# Patient Record
Sex: Male | Born: 1960 | Race: White | Hispanic: No | Marital: Married | Smoking: Former smoker
Health system: Southern US, Community
[De-identification: ages and names within clinical notes are randomized; demographics above are authoritative.]

## PROBLEM LIST (undated history)

## (undated) DIAGNOSIS — IMO0001 Reserved for inherently not codable concepts without codable children: Secondary | ICD-10-CM

## (undated) DIAGNOSIS — IMO0002 Reserved for concepts with insufficient information to code with codable children: Secondary | ICD-10-CM

## (undated) DIAGNOSIS — F419 Anxiety disorder, unspecified: Secondary | ICD-10-CM

## (undated) DIAGNOSIS — N400 Enlarged prostate without lower urinary tract symptoms: Secondary | ICD-10-CM

## (undated) DIAGNOSIS — E785 Hyperlipidemia, unspecified: Secondary | ICD-10-CM

## (undated) DIAGNOSIS — F319 Bipolar disorder, unspecified: Secondary | ICD-10-CM

## (undated) DIAGNOSIS — T7840XA Allergy, unspecified, initial encounter: Secondary | ICD-10-CM

## (undated) DIAGNOSIS — G709 Myoneural disorder, unspecified: Secondary | ICD-10-CM

## (undated) DIAGNOSIS — M1990 Unspecified osteoarthritis, unspecified site: Secondary | ICD-10-CM

## (undated) DIAGNOSIS — Z5189 Encounter for other specified aftercare: Secondary | ICD-10-CM

## (undated) HISTORY — DX: Hyperlipidemia, unspecified: E78.5

## (undated) HISTORY — DX: Encounter for other specified aftercare: Z51.89

## (undated) HISTORY — DX: Myoneural disorder, unspecified: G70.9

## (undated) HISTORY — PX: TONSILLECTOMY: SUR1361

## (undated) HISTORY — DX: Reserved for inherently not codable concepts without codable children: IMO0001

## (undated) HISTORY — PX: ROTATOR CUFF REPAIR: SHX139

## (undated) HISTORY — DX: Allergy, unspecified, initial encounter: T78.40XA

## (undated) HISTORY — DX: Unspecified osteoarthritis, unspecified site: M19.90

## (undated) HISTORY — DX: Reserved for concepts with insufficient information to code with codable children: IMO0002

## (undated) HISTORY — DX: Anxiety disorder, unspecified: F41.9

## (undated) HISTORY — PX: NASAL SINUS SURGERY: SHX719

## (undated) HISTORY — DX: Benign prostatic hyperplasia without lower urinary tract symptoms: N40.0

## (undated) HISTORY — DX: Bipolar disorder, unspecified: F31.9

---
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
## 2000-05-23 ENCOUNTER — Emergency Department (HOSPITAL_COMMUNITY): Admission: EM | Admit: 2000-05-23 | Discharge: 2000-05-23 | Payer: Self-pay

## 2001-06-03 ENCOUNTER — Ambulatory Visit (HOSPITAL_COMMUNITY): Admission: RE | Admit: 2001-06-03 | Discharge: 2001-06-03 | Payer: Self-pay | Admitting: Family Medicine

## 2001-06-03 ENCOUNTER — Encounter: Payer: Self-pay | Admitting: Family Medicine

## 2001-06-17 ENCOUNTER — Ambulatory Visit (HOSPITAL_COMMUNITY): Admission: RE | Admit: 2001-06-17 | Discharge: 2001-06-17 | Payer: Self-pay | Admitting: *Deleted

## 2001-06-17 ENCOUNTER — Encounter: Payer: Self-pay | Admitting: *Deleted

## 2001-11-28 ENCOUNTER — Encounter: Payer: Self-pay | Admitting: *Deleted

## 2001-11-28 ENCOUNTER — Encounter: Admission: RE | Admit: 2001-11-28 | Discharge: 2001-11-28 | Payer: Self-pay | Admitting: *Deleted

## 2001-12-12 ENCOUNTER — Encounter: Payer: Self-pay | Admitting: Family Medicine

## 2001-12-12 ENCOUNTER — Encounter: Admission: RE | Admit: 2001-12-12 | Discharge: 2001-12-12 | Payer: Self-pay | Admitting: Family Medicine

## 2003-01-06 ENCOUNTER — Inpatient Hospital Stay (HOSPITAL_COMMUNITY): Admission: EM | Admit: 2003-01-06 | Discharge: 2003-01-07 | Payer: Self-pay | Admitting: Emergency Medicine

## 2003-01-06 ENCOUNTER — Encounter: Payer: Self-pay | Admitting: Emergency Medicine

## 2006-06-24 ENCOUNTER — Encounter: Admission: RE | Admit: 2006-06-24 | Discharge: 2006-06-24 | Payer: Self-pay | Admitting: Orthopedic Surgery

## 2006-06-27 ENCOUNTER — Encounter: Admission: RE | Admit: 2006-06-27 | Discharge: 2006-06-27 | Payer: Self-pay | Admitting: Orthopedic Surgery

## 2007-12-22 ENCOUNTER — Ambulatory Visit: Payer: Self-pay | Admitting: Internal Medicine

## 2007-12-22 LAB — CONVERTED CEMR LAB
ALT: 39 units/L (ref 0–53)
AST: 25 units/L (ref 0–37)
Alkaline Phosphatase: 19 units/L — ABNORMAL LOW (ref 39–117)
Basophils Absolute: 0.1 10*3/uL (ref 0.0–0.1)
CO2: 30 meq/L (ref 19–32)
Chloride: 110 meq/L (ref 96–112)
Eosinophils Absolute: 0.3 10*3/uL (ref 0.0–0.7)
Eosinophils Relative: 4 % (ref 0.0–5.0)
GFR calc non Af Amer: 85 mL/min
HDL: 33 mg/dL — ABNORMAL LOW (ref >39.0)
Hemoglobin, Urine: NEGATIVE
Leukocytes, UA: NEGATIVE
Lymphocytes Relative: 31.1 % (ref 12.0–46.0)
MCHC: 34.8 g/dL (ref 30.0–36.0)
MCV: 91.9 fL (ref 78.0–100.0)
Neutrophils Relative %: 56.7 % (ref 43.0–77.0)
Platelets: 234 10*3/uL (ref 150–400)
Potassium: 4.6 meq/L (ref 3.5–5.1)
RBC: 4.88 M/uL (ref 4.22–5.81)
Specific Gravity, Urine: 1.015 (ref 1.000–1.03)
Total Bilirubin: 0.6 mg/dL (ref 0.3–1.2)
Total CHOL/HDL Ratio: 6.7
Urine Glucose: NEGATIVE mg/dL
Urobilinogen, UA: 0.2 (ref 0.0–1.0)
VLDL: 32 mg/dL (ref 0–40)
WBC: 8.1 10*3/uL (ref 4.5–10.5)

## 2007-12-24 ENCOUNTER — Ambulatory Visit: Payer: Self-pay | Admitting: Internal Medicine

## 2007-12-24 DIAGNOSIS — N401 Enlarged prostate with lower urinary tract symptoms: Secondary | ICD-10-CM | POA: Insufficient documentation

## 2007-12-24 DIAGNOSIS — F3176 Bipolar disorder, in full remission, most recent episode depressed: Secondary | ICD-10-CM | POA: Insufficient documentation

## 2007-12-24 DIAGNOSIS — E785 Hyperlipidemia, unspecified: Secondary | ICD-10-CM | POA: Insufficient documentation

## 2007-12-24 DIAGNOSIS — M5137 Other intervertebral disc degeneration, lumbosacral region: Secondary | ICD-10-CM | POA: Insufficient documentation

## 2008-06-28 ENCOUNTER — Ambulatory Visit: Payer: Self-pay | Admitting: Internal Medicine

## 2008-06-28 DIAGNOSIS — R6882 Decreased libido: Secondary | ICD-10-CM | POA: Insufficient documentation

## 2008-06-28 DIAGNOSIS — N400 Enlarged prostate without lower urinary tract symptoms: Secondary | ICD-10-CM | POA: Insufficient documentation

## 2008-06-30 ENCOUNTER — Encounter: Payer: Self-pay | Admitting: Internal Medicine

## 2008-11-08 ENCOUNTER — Ambulatory Visit: Payer: Self-pay | Admitting: Internal Medicine

## 2008-11-08 LAB — CONVERTED CEMR LAB
Albumin: 4 g/dL (ref 3.5–5.2)
Alkaline Phosphatase: 19 units/L — ABNORMAL LOW (ref 39–117)
Cholesterol: 206 mg/dL — ABNORMAL HIGH (ref 0–200)
Direct LDL: 153.2 mg/dL
HDL: 40.3 mg/dL (ref >39.00)
Total Protein: 7.3 g/dL (ref 6.0–8.3)
Triglycerides: 90 mg/dL (ref 0.0–149.0)
VLDL: 18 mg/dL (ref 0.0–40.0)

## 2010-02-08 ENCOUNTER — Observation Stay (HOSPITAL_COMMUNITY)
Admission: EM | Admit: 2010-02-08 | Discharge: 2010-02-09 | Payer: Self-pay | Source: Home / Self Care | Admitting: Emergency Medicine

## 2010-02-08 ENCOUNTER — Ambulatory Visit: Payer: Self-pay | Admitting: Internal Medicine

## 2010-02-09 ENCOUNTER — Encounter (INDEPENDENT_AMBULATORY_CARE_PROVIDER_SITE_OTHER): Payer: Self-pay | Admitting: Emergency Medicine

## 2010-02-10 ENCOUNTER — Ambulatory Visit: Payer: Self-pay | Admitting: Internal Medicine

## 2010-02-10 DIAGNOSIS — K219 Gastro-esophageal reflux disease without esophagitis: Secondary | ICD-10-CM | POA: Insufficient documentation

## 2010-03-10 ENCOUNTER — Ambulatory Visit: Payer: Self-pay | Admitting: Internal Medicine

## 2010-03-10 DIAGNOSIS — M545 Low back pain, unspecified: Secondary | ICD-10-CM | POA: Insufficient documentation

## 2010-04-03 ENCOUNTER — Telehealth: Payer: Self-pay | Admitting: Internal Medicine

## 2010-04-04 ENCOUNTER — Encounter: Payer: Self-pay | Admitting: Internal Medicine

## 2010-04-23 LAB — CONVERTED CEMR LAB
Cholesterol, target level: 200 mg/dL
Cholesterol: 229 mg/dL — ABNORMAL HIGH (ref 0–200)
Direct LDL: 165 mg/dL
HDL goal, serum: 40 mg/dL
LDL Goal: 130 mg/dL
TSH: 1.05 microintl units/mL (ref 0.35–5.50)
Triglycerides: 126 mg/dL (ref 0.0–149.0)

## 2010-04-25 NOTE — Letter (Signed)
Summary: Out of Work  [facility]  [address] [place], [place] [address]   Phone: [phone]  Fax: [phone]    February 10, 2010   Employee:  PIERO MUSTARD    To Whom It May Concern:   For Medical reasons, please excuse the above named employee from work. He may return to work on 02/20/2010.     If you need additional information, please feel free to contact our office.         Sincerely,    Alvy Beal A CMA for Dr. Sanda Linger  Appended Document: Out of Work return to work on 02/13/10/la

## 2010-04-25 NOTE — Assessment & Plan Note (Signed)
Summary: POST HOSP FU--DUE TO CHEST PAIN  STC   Vital Signs:  Patient profile:   50 year old male Height:      68 inches Weight:      150 pounds BMI:     22.89 O2 Sat:      99 % on Room air Temp:     97.9 degrees F oral Pulse rate:   76 / minute Pulse rhythm:   regular Resp:     16 per minute BP sitting:   114 / 80  (left arm) Cuff size:   large  Vitals Entered By: Rock Nephew CMA (February 10, 2010 2:05 PM)  O2 Flow:  Room air CC: Hospital follow up, Heartburn Is Patient Diabetic? No Pain Assessment Patient in pain? no        Primary Care Provider:  Etta Grandchild MD  CC:  Hospital follow up and Heartburn.  History of Present Illness:  Heartburn      This is a 50 year old man who presents with Heartburn.  The symptoms began 2 weeks ago.  The intensity is described as moderate.  The patient reports acid reflux, epigastric pain, and chest pain, but denies sour taste in mouth, trouble swallowing, weight loss, and weight gain.  The patient denies the following alarm features: melena, dysphagia, hematemesis, vomiting, involuntary weight loss >5%, and history of anemia.  Symptoms are worse with NSAIDs.  Treatment that was tried and either found to be ineffective or stopped due to problems include dietary changes, weight loss, and elevating the head of bed.  He had an overnight stay at the hospital 2 days ago and a cardiac cause for his chest pain was ruled out.  Dyspepsia History:      He has no alarm features of dyspepsia including no history of melena, hematochezia, dysphagia, persistent vomiting, or involuntary weight loss > 5%.  There is a prior history of GERD.  The patient does not have a prior history of documented ulcer disease.  The dominant symptom is heartburn or acid reflux.  An H-2 blocker medication is not currently being taken.     Preventive Screening-Counseling & Management  Alcohol-Tobacco     Alcohol drinks/day: 2     Alcohol type: beer     >5/day in  last 3 mos: no     Alcohol Counseling: not indicated; use of alcohol is not excessive or problematic     Feels need to cut down: no     Feels annoyed by complaints: no     Feels guilty re: drinking: no     Needs 'eye opener' in am: no     Smoking Status: quit > 6 months     Smoking Cessation Counseling: yes     Tobacco Counseling: to remain off tobacco products  Hep-HIV-STD-Contraception     Hepatitis Risk: no risk noted     HIV Risk: no risk noted     STD Risk: no risk noted     Sun Exposure-Excessive: yes     Sun Exposure Counseling: to decrease sun exposure      Sexual History:  currently monogamous.        Drug Use:  never.        Blood Transfusions:  no.    Medications Prior to Update: 1)  Seroquel 25 Mg Tabs (Quetiapine Fumarate) .... Take 1 Tablet By Mouth Once A Day 2)  Lamictal 100 Mg Tabs (Lamotrigine) 3)  Equetro 200 Mg Xr12h-Cap (Carbamazepine (Antipsychotic)) 4)  Diazepam 5 Mg Tabs (Diazepam) .... As Needed  Current Medications (verified): 1)  Seroquel 25 Mg Tabs (Quetiapine Fumarate) .... Take 1 Tablet By Mouth Once A Day 2)  Lamictal 100 Mg Tabs (Lamotrigine) 3)  Equetro 200 Mg Xr12h-Cap (Carbamazepine (Antipsychotic)) 4)  Diazepam 5 Mg Tabs (Diazepam) .... As Needed 5)  Prilosec Otc 20 Mg Tbec (Omeprazole Magnesium) .Marland Kitchen.. 1-2 By Mouth Once Daily For Heartburn  Allergies (verified): 1)  ! Penicillin  Past History:  Past Medical History: Last updated: 06/28/2008 Hyperlipidemia Benign prostatic hypertrophy Bipolar DDD  Past Surgical History: Last updated: 12/24/2007 Sinus surgery Tonsillectomy  Family History: Last updated: 12/24/2007 Family History of Arthritis Family History of Suicide attempt Ffather died from mesothelioma at 50 years of age  Social History: Last updated: 06/28/2008 Occupation:carpenter, student Married Alcohol use-yes Drug use-no Regular exercise-no  Risk Factors: Alcohol Use: 2 (02/10/2010) >5 drinks/d w/in last 3  months: no (02/10/2010) Exercise: no (12/24/2007)  Risk Factors: Smoking Status: quit > 6 months (02/10/2010)  Family History: Reviewed history from 12/24/2007 and no changes required. Family History of Arthritis Family History of Suicide attempt Ffather died from mesothelioma at 50 years of age  Social History: Reviewed history from 06/28/2008 and no changes required. Occupation:carpenter, student Married Alcohol use-yes Drug use-no Regular exercise-no  Review of Systems       The patient complains of chest pain and severe indigestion/heartburn.  The patient denies anorexia, fever, weight loss, weight gain, dyspnea on exertion, peripheral edema, prolonged cough, headaches, hemoptysis, abdominal pain, melena, hematochezia, hematuria, suspicious skin lesions, difficulty walking, depression, and angioedema.   Psych:  Complains of irritability; denies alternate hallucination ( auditory/visual), anxiety, depression, easily angered, easily tearful, mental problems, panic attacks, sense of great danger, suicidal thoughts/plans, thoughts of violence, unusual visions or sounds, and thoughts /plans of harming others.  Physical Exam  General:  alert, well-developed, well-nourished, well-hydrated, and appropriate dress.   Mouth:  Oral mucosa and oropharynx without lesions or exudates.  Teeth in good repair. Neck:  supple, full ROM, no masses, and no thyromegaly.   Lungs:  Normal respiratory effort, chest expands symmetrically. Lungs are clear to auscultation, no crackles or wheezes. Heart:  Normal rate and regular rhythm. S1 and S2 normal without gallop, murmur, click, rub or other extra sounds. Abdomen:  soft, non-tender, normal bowel sounds, no hepatomegaly, and no splenomegaly.   Rectal:  no external abnormalities, no hemorrhoids, normal sphincter tone, no masses, no tenderness, no fissures, no fistulae, and no perianal rash.   Genitalia:  circumcised, no hydrocele, no varicocele, no  scrotal masses, no testicular masses or atrophy, no cutaneous lesions, and no urethral discharge.   Prostate:  no nodules, no asymmetry, no induration, and 1+ enlarged.   Msk:  normal ROM, no joint tenderness, no joint swelling, no joint warmth, no redness over joints, no joint deformities, no joint instability, and no crepitation.   Skin:  turgor normal, color normal, no rashes, no suspicious lesions, no ecchymoses, no petechiae, no purpura, no ulcerations, and no edema.   Cervical Nodes:  no anterior cervical adenopathy and no posterior cervical adenopathy.   Inguinal Nodes:  no R inguinal adenopathy and no L inguinal adenopathy.   Psych:  Oriented X3, memory intact for recent and remote, normally interactive, good eye contact, not anxious appearing, not depressed appearing, not agitated, not suicidal, and not homicidal.     Impression & Recommendations:  Problem # 1:  GERD (ICD-530.81) Assessment New  His updated medication list for this problem  includes:    Prilosec Otc 20 Mg Tbec (Omeprazole magnesium) .Marland Kitchen... 1-2 by mouth once daily for heartburn  Labs Reviewed: Hgb: 15.6 (12/22/2007)   Hct: 44.8 (12/22/2007)  Orders: Hemoccult Guaiac-1 spec.(in office) (82270)  Complete Medication List: 1)  Seroquel 25 Mg Tabs (Quetiapine fumarate) .... Take 1 tablet by mouth once a day 2)  Lamictal 100 Mg Tabs (Lamotrigine) 3)  Equetro 200 Mg Xr12h-cap (Carbamazepine (antipsychotic)) 4)  Diazepam 5 Mg Tabs (Diazepam) .... As needed 5)  Prilosec Otc 20 Mg Tbec (Omeprazole magnesium) .Marland Kitchen.. 1-2 by mouth once daily for heartburn  Colorectal Screening:  Current Recommendations:    Hemoccult: NEG X 1 today  PSA Screening:    PSA: 1.15  (12/22/2007)  Patient Instructions: 1)  Please schedule a follow-up appointment in 1 month. 2)  Avoid foods high in acid (tomatoes, citrus juices, spicy foods). Avoid eating within two hours of lying down or before exercising. Do not over eat; try smaller more  frequent meals. Elevate head of bed twelve inches when sleeping. Prescriptions: PRILOSEC OTC 20 MG TBEC (OMEPRAZOLE MAGNESIUM) 1-2 by mouth once daily for heartburn  #60 x 0   Entered and Authorized by:   Etta Grandchild MD   Signed by:   Etta Grandchild MD on 02/10/2010   Method used:   Samples Given   RxID:   [id number]    Orders Added: 1)  Est. Patient Level IV [72536] 2)  Hemoccult Guaiac-1 spec.(in office) [82270]

## 2010-04-25 NOTE — Letter (Signed)
Summary: Out of Work  [facility]  [address] [place], [place] [address]   Phone: [phone]  Fax: [phone]    February 10, 2010   Employee:  RAINN BULLINGER    To Whom It May Concern:  For Medical reasons, please excuse the above named employee from work. He may return to work on 02/13/2010.  If you need additional information, please feel free to contact our office.         Sincerely,    Alvy Beal A CMA for Dr. Sanda Linger

## 2010-04-27 NOTE — Assessment & Plan Note (Signed)
Summary: 1 MOS F/U #/CD   Vital Signs:  Patient profile:   50 year old male Height:      68 inches Weight:      148.38 pounds BMI:     22.64 O2 Sat:      96 % on Room air Temp:     97.9 degrees F oral Pulse rate:   60 / minute Pulse rhythm:   regular Resp:     16 per minute BP sitting:   118 / 70  (left arm) Cuff size:   large  Vitals Entered By: Rock Nephew CMA (March 10, 2010 2:34 PM)  O2 Flow:  Room air  Primary Care Provider:  Etta Grandchild MD  CC:  Back pain.  History of Present Illness:  Back Pain      This is a 50 year old man who presents with Back pain.  The symptoms began 4-8 weeks ago.  The intensity is described as mild.  The patient denies fever, chills, weakness, loss of sensation, fecal incontinence, urinary incontinence, urinary retention, dysuria, rest pain, inability to work, and inability to care for self.  The pain is located in the mid low back.  The pain began at work and gradually.  The pain radiates to the left buttock.  The pain is made worse by flexion and extension.  The pain is made better by inactivity.    Preventive Screening-Counseling & Management  Alcohol-Tobacco     Alcohol drinks/day: 2     Alcohol type: beer     >5/day in last 3 mos: no     Alcohol Counseling: not indicated; use of alcohol is not excessive or problematic     Feels need to cut down: no     Feels annoyed by complaints: no     Feels guilty re: drinking: no     Needs 'eye opener' in am: no     Smoking Status: quit > 6 months     Smoking Cessation Counseling: yes     Tobacco Counseling: to remain off tobacco products  Hep-HIV-STD-Contraception     Hepatitis Risk: no risk noted     HIV Risk: no risk noted     STD Risk: no risk noted     Sun Exposure-Excessive: yes     Sun Exposure Counseling: to decrease sun exposure      Sexual History:  currently monogamous.        Drug Use:  never.        Blood Transfusions:  no.    Clinical Review  Panels:  Prevention   Last PSA:  1.15 (12/22/2007)  Lipid Management   Cholesterol:  206 (11/08/2008)   LDL (bad choesterol):  DEL (12/22/2007)   HDL (good cholesterol):  40.30 (11/08/2008)  Diabetes Management   HgBA1C:  5.5 (06/28/2008)   Creatinine:  1.0 (12/22/2007)  CBC   WBC:  8.1 (12/22/2007)   RBC:  4.88 (12/22/2007)   Hgb:  15.6 (12/22/2007)   Hct:  44.8 (12/22/2007)   Platelets:  234 (12/22/2007)   MCV  91.9 (12/22/2007)   MCHC  34.8 (12/22/2007)   RDW  12.9 (12/22/2007)   PMN:  56.7 (12/22/2007)   Lymphs:  31.1 (12/22/2007)   Monos:  7.4 (12/22/2007)   Eosinophils:  4.0 (12/22/2007)   Basophil:  0.8 (12/22/2007)  Complete Metabolic Panel   Glucose:  101 (12/22/2007)   Sodium:  143 (12/22/2007)   Potassium:  4.6 (12/22/2007)   Chloride:  110 (12/22/2007)  CO2:  30 (12/22/2007)   BUN:  9 (12/22/2007)   Creatinine:  1.0 (12/22/2007)   Albumin:  4.0 (11/08/2008)   Total Protein:  7.3 (11/08/2008)   Calcium:  9.2 (12/22/2007)   Total Bili:  0.7 (11/08/2008)   Alk Phos:  19 (11/08/2008)   SGPT (ALT):  42 (11/08/2008)   SGOT (AST):  32 (11/08/2008)   Medications Prior to Update: 1)  Seroquel 25 Mg Tabs (Quetiapine Fumarate) .... Take 1 Tablet By Mouth Once A Day 2)  Lamictal 100 Mg Tabs (Lamotrigine) 3)  Equetro 200 Mg Xr12h-Cap (Carbamazepine (Antipsychotic)) 4)  Diazepam 5 Mg Tabs (Diazepam) .... As Needed 5)  Prilosec Otc 20 Mg Tbec (Omeprazole Magnesium) .Marland Kitchen.. 1-2 By Mouth Once Daily For Heartburn  Current Medications (verified): 1)  Seroquel 25 Mg Tabs (Quetiapine Fumarate) .... Take 1 Tablet By Mouth Once A Day 2)  Lamictal 100 Mg Tabs (Lamotrigine) 3)  Equetro 200 Mg Xr12h-Cap (Carbamazepine (Antipsychotic)) 4)  Diazepam 5 Mg Tabs (Diazepam) .... As Needed 5)  Prilosec Otc 20 Mg Tbec (Omeprazole Magnesium) .Marland Kitchen.. 1-2 By Mouth Once Daily For Heartburn 6)  Tramadol Hcl 50 Mg Tabs (Tramadol Hcl) .Marland Kitchen.. 1-2 By Mouth Qid As Needed For Pain  Allergies  (verified): 1)  ! Penicillin  Past History:  Past Medical History: Last updated: 06/28/2008 Hyperlipidemia Benign prostatic hypertrophy Bipolar DDD  Past Surgical History: Last updated: 12/24/2007 Sinus surgery Tonsillectomy  Family History: Last updated: 12/24/2007 Family History of Arthritis Family History of Suicide attempt Ffather died from mesothelioma at 50 years of age  Social History: Last updated: 06/28/2008 Occupation:carpenter, student Married Alcohol use-yes Drug use-no Regular exercise-no  Risk Factors: Alcohol Use: 2 (03/10/2010) >5 drinks/d w/in last 3 months: no (03/10/2010) Exercise: no (12/24/2007)  Risk Factors: Smoking Status: quit > 6 months (03/10/2010)  Family History: Reviewed history from 12/24/2007 and no changes required. Family History of Arthritis Family History of Suicide attempt Ffather died from mesothelioma at 50 years of age  Social History: Reviewed history from 06/28/2008 and no changes required. Occupation:carpenter, student Married Alcohol use-yes Drug use-no Regular exercise-no  Review of Systems  The patient denies anorexia, fever, weight loss, weight gain, hoarseness, chest pain, syncope, dyspnea on exertion, peripheral edema, prolonged cough, headaches, hemoptysis, abdominal pain, melena, hematochezia, severe indigestion/heartburn, hematuria, suspicious skin lesions, enlarged lymph nodes, and angioedema.    Physical Exam  General:  alert, well-developed, well-nourished, well-hydrated, and appropriate dress.   Head:  normocephalic and atraumatic.   Mouth:  Oral mucosa and oropharynx without lesions or exudates.  Teeth in good repair. Neck:  supple, full ROM, no masses, and no thyromegaly.   Lungs:  Normal respiratory effort, chest expands symmetrically. Lungs are clear to auscultation, no crackles or wheezes. Heart:  Normal rate and regular rhythm. S1 and S2 normal without gallop, murmur, click, rub or other  extra sounds. Abdomen:  soft, non-tender, normal bowel sounds, no hepatomegaly, and no splenomegaly.   Msk:  normal ROM, no joint tenderness, no joint swelling, no joint warmth, no redness over joints, no joint deformities, no joint instability, and no crepitation.   Pulses:  R and L carotid,radial,femoral,dorsalis pedis and posterior tibial pulses are full and equal bilaterally Extremities:  No clubbing, cyanosis, edema, or deformity noted with normal full range of motion of all joints.   Neurologic:  No cranial nerve deficits noted. Station and gait are normal. Plantar reflexes are down-going bilaterally. DTRs are symmetrical throughout. Sensory, motor and coordinative functions appear intact. Skin:  Intact without  suspicious lesions or rashes Cervical Nodes:  no anterior cervical adenopathy and no posterior cervical adenopathy.   Axillary Nodes:  no R axillary adenopathy and no L axillary adenopathy.   Inguinal Nodes:  no R inguinal adenopathy and no L inguinal adenopathy.   Psych:  Cognition and judgment appear intact. Alert and cooperative with normal attention span and concentration. No apparent delusions, illusions, hallucinations   Detailed Back/Spine Exam  General:    Well-developed, well-nourished, in no acute distress; alert and oriented x 3.    Gait:    Normal heel-toe gait pattern bilaterally.    Lumbosacral Exam:  Inspection-deformity:    Normal Palpation-spinal tenderness:  Normal Range of Motion:    Forward Flexion:   60 degrees    Hyperextension:   25 degrees    Schober's:        >6 cm    Right Lateral Bend:   25 degrees    Left Lateral Bend:   25 degrees Squatting:  normal Lying Straight Leg Raise:    Right:  negative    Left:  negative Sitting Straight Leg Raise:    Right:  negative    Left:  negative Reverse Straight Leg Raise:    Right:  negative    Left:  negative Contralateral Straight Leg Raise:    Right:  negative    Left:  negative Sciatic  Notch:    There is no sciatic notch tenderness. Toe Walking:    Right:  normal    Left:  normal Heel Walking:    Right:  normal    Left:  normal   Impression & Recommendations:  Problem # 1:  LOW BACK PAIN, CHRONIC (ICD-724.2) Assessment New  His updated medication list for this problem includes:    Tramadol Hcl 50 Mg Tabs (Tramadol hcl) .Marland Kitchen... 1-2 by mouth qid as needed for pain  Orders: Orthopedic Referral (Ortho)  Problem # 2:  GERD (ICD-530.81) Assessment: Improved  His updated medication list for this problem includes:    Prilosec Otc 20 Mg Tbec (Omeprazole magnesium) .Marland Kitchen... 1-2 by mouth once daily for heartburn  Labs Reviewed: Hgb: 15.6 (12/22/2007)   Hct: 44.8 (12/22/2007)  Complete Medication List: 1)  Seroquel 25 Mg Tabs (Quetiapine fumarate) .... Take 1 tablet by mouth once a day 2)  Lamictal 100 Mg Tabs (Lamotrigine) 3)  Equetro 200 Mg Xr12h-cap (Carbamazepine (antipsychotic)) 4)  Diazepam 5 Mg Tabs (Diazepam) .... As needed 5)  Prilosec Otc 20 Mg Tbec (Omeprazole magnesium) .Marland Kitchen.. 1-2 by mouth once daily for heartburn 6)  Tramadol Hcl 50 Mg Tabs (Tramadol hcl) .Marland Kitchen.. 1-2 by mouth qid as needed for pain  Patient Instructions: 1)  Please schedule a follow-up appointment in 1 month. 2)  Avoid foods high in acid (tomatoes, citrus juices, spicy foods). Avoid eating within two hours of lying down or before exercising. Do not over eat; try smaller more frequent meals. Elevate head of bed twelve inches when sleeping. 3)  Take 650-1000mg  of Tylenol every 4-6 hours as needed for relief of pain or comfort of fever AVOID taking more than 4000mg   in a 24 hour period (can cause liver damage in higher doses). 4)  Take 400-600mg  of Ibuprofen (Advil, Motrin) with food every 4-6 hours as needed for relief of pain or comfort of fever. 5)  Most patients (90%) with low back pain will improve with time (2-6 weeks). Keep active but avoid activities that are painful. Apply moist heat and/or  ice to lower back several times  a day. Prescriptions: TRAMADOL HCL 50 MG TABS (TRAMADOL HCL) 1-2 by mouth QID as needed for pain  #75 x 3   Entered and Authorized by:   Etta Grandchild MD   Signed by:   Etta Grandchild MD on 03/10/2010   Method used:   Electronically to        [facility]. [phone]* (retail)       [address]       [place], [place]  [address]       Ph: [phone] or [phone]       Fax: [phone]   RxID:   [id number]    Orders Added: 1)  Orthopedic Referral [Ortho] 2)  Est. Patient Level IV [66440]

## 2010-04-27 NOTE — Letter (Signed)
Summary: Referral - not able to see patient  [facility]  [address] [place], [place] [address]   Phone: [phone]  Fax: [phone]    April 04, 2010   Sanda Linger, MD [address] [place], [place] [address]   Re:   Colton Andersen DOB:  10/14/1960 MRN:   [id number]    Dear Dr. Yetta Barre:  Thank you for your kind referral of the above patient.  We have attempted to schedule the recommended procedure Endoscopy but have not been able to schedule because:  ___ The patient was not available by phone and/or has not returned our calls.  X   The patient declined to schedule the procedure at this time.  We appreciate the referral and hope that we will have the opportunity to treat this patient in the future.    Sincerely,    [facility] [phone]

## 2010-04-27 NOTE — Progress Notes (Signed)
Summary: Patient requesting GI referral.  Phone Note Call from Patient   Caller: Spouse Summary of Call: Patient spouse called stating that patient wants a GI referral for an endoscopy. Please Advise. [phone] Christy. Initial call taken by: Daphane Shepherd,  April 03, 2010 1:43 PM

## 2010-06-06 LAB — POCT CARDIAC MARKERS
CKMB, poc: <1 ng/mL — ABNORMAL LOW (ref 1.0–8.0)
CKMB, poc: <1 ng/mL — ABNORMAL LOW (ref 1.0–8.0)
CKMB, poc: <1 ng/mL — ABNORMAL LOW (ref 1.0–8.0)
Myoglobin, poc: 48 ng/mL (ref 12–200)
Myoglobin, poc: 53.2 ng/mL (ref 12–200)
Myoglobin, poc: 65.6 ng/mL (ref 12–200)
Myoglobin, poc: 67.6 ng/mL (ref 12–200)
Troponin i, poc: <0.05 ng/mL (ref 0.00–0.09)
Troponin i, poc: <0.05 ng/mL (ref 0.00–0.09)
Troponin i, poc: <0.05 ng/mL (ref 0.00–0.09)

## 2010-06-06 LAB — CBC
HCT: 41 % (ref 39.0–52.0)
Hemoglobin: 14.1 g/dL (ref 13.0–17.0)
MCH: 30.9 pg (ref 26.0–34.0)
MCHC: 34.4 g/dL (ref 30.0–36.0)
MCV: 89.7 fL (ref 78.0–100.0)
Platelets: 221 10*3/uL (ref 150–400)
RBC: 4.57 MIL/uL (ref 4.22–5.81)
RDW: 12.9 % (ref 11.5–15.5)
WBC: 4.9 10*3/uL (ref 4.0–10.5)

## 2010-06-06 LAB — DIFFERENTIAL
Basophils Absolute: 0 10*3/uL (ref 0.0–0.1)
Basophils Relative: 1 % (ref 0–1)
Monocytes Relative: 9 % (ref 3–12)
Neutro Abs: 2.2 10*3/uL (ref 1.7–7.7)
Neutrophils Relative %: 46 % (ref 43–77)

## 2010-06-06 LAB — BASIC METABOLIC PANEL
BUN: 10 mg/dL (ref 6–23)
CO2: 28 mEq/L (ref 19–32)
Calcium: 9.2 mg/dL (ref 8.4–10.5)
Chloride: 105 mEq/L (ref 96–112)
Creatinine, Ser: 0.95 mg/dL (ref 0.4–1.5)
GFR calc Af Amer: >60 mL/min (ref >60)
GFR calc non Af Amer: >60 mL/min (ref >60)
Glucose, Bld: 101 mg/dL — ABNORMAL HIGH (ref 70–99)
Potassium: 4.5 mEq/L (ref 3.5–5.1)
Sodium: 138 mEq/L (ref 135–145)

## 2010-08-11 NOTE — Discharge Summary (Signed)
NAME:  Colton Andersen, Colton Andersen                        ACCOUNT NO.:  [id number]   MEDICAL RECORD NO.:  [id number]                   PATIENT TYPE:  INP   LOCATION:  0371                                 FACILITY:  [facility]   PHYSICIAN:  Jackie Plum, M.D.             DATE OF BIRTH:  Dec 07, 1960   DATE OF ADMISSION:  01/06/2003  DATE OF DISCHARGE:  01/07/2003                                 DISCHARGE SUMMARY   DISCHARGE DIAGNOSES:  1. Chest pain, no evidence of myocardial infarction by serial cardiac     enzymes.     a. The patient is scheduled for stress test by Dr. Amil Amen this morning        on discharge at the office.  2. 1.7 cm hilar node per CT scan done at the ED yesterday, this is an     incidental node. The patient has history of cigarette smoking and quit a     few weeks ago. Three month followup CT recommended and primary care     physician  informed this morning by telephone.  3. History of depression.   DISCHARGE MEDICATIONS:  The patient is to resume his preadmission  medications. New medicines aspirin 325 mg p.o. daily.   REASON FOR HOSPITALIZATION:  Chest pain.  The patient presented with chest  pain and was seen in the ED at which point a CT was done for PE which was  negative. The patient was therefore admitted to the hospital service for  further evaluation of chest pain.   HOSPITAL COURSE:  Serial cardiac enzymes were obtained which ruled out  myocardial infarction. The patient's chest pain resolved at the time of  admission. His EKG showed normal sinus rhythm and there were no significant  dysrhythmias while he was in house. This morning the patient is chest pain  free, he is hemodynamically stable, does not have any other systemic  complaints and after discussing the patient with Dr. Amil Amen, he agreed to  do a stress test for him in the office i.e. treadmill. The patient was upset  about his lab workup diagnosis and is going to leave the hospital this  morning and  go for his stress test. I also spoke with Dr. Francesca Oman  about the patient's incidental hilar node findings and he understands that  he is going to do a followup CT in about 2-3 months.    CONSULTATIONS:  Dr. Amil Amen of cardiology for stress test.   CONDITION ON DISCHARGE:  Stable, satisfactory condition.                                               Jackie Plum, M.D.    GO/MEDQ  D:  01/07/2003  T:  01/07/2003  Job:  [id number]   cc:   Humberto Leep.  Amil Amen, M.D.  [address]  [place]  [place] [address]  Fax: [phone]   Meredith Staggers, M.D.  [address]  [place]  [place] [address]  Fax: [phone]

## 2010-08-11 NOTE — H&P (Signed)
NAME:  Colton Andersen, Colton Andersen                        ACCOUNT NO.:  [id number]   MEDICAL RECORD NO.:  [id number]                   PATIENT TYPE:  EMS   LOCATION:  ED                                   FACILITY:  [facility]   PHYSICIAN:  Jackie Plum, M.D.             DATE OF BIRTH:  [birth year]   DATE OF ADMISSION:  01/06/2003  DATE OF DISCHARGE:                                HISTORY & PHYSICAL   PROBLEM LIST:  1. Chest pain, rule out myocardial infarction, rule out myocardia ischemia.  2. History of depression.  3. History of about 26-to 34-pack-year history of cigarette smoking, quit     last month.  4. A 1 cm hilar node per CT.  Ask the patient to follow up with three month     repeat CT recommended.   CHIEF COMPLAINT:  Chest pain.   HISTORY OF PRESENT ILLNESS:  The patient is a 50 year old Caucasian  gentleman with coronary artery disease risk factors of male sex and history  of cigarette smoking (quit about 3-4 weeks ago), who presents with squeezing  mid sternal chest pain this morning.  He was apparently in his usual state  of health until this morning while he was lacing his shoes, when he started  to experience exquisite mid sternal squeezing chest pain which was  nonradiating, associated with nausea, shortness of breath, diaphoresis,  without vomiting.  No history of palpitations, dizziness, loss of  consciousness, fever, chills, cough, sputum production, lower extremity pain  or swelling.  There was no known alleviating or aggravating factor.  The  peak of the chest pain was identified as moderate in intensity, graded about  5-6/10 and lasted about one and half to two hours.   In the emergency room, the patient's BP initially was 144/103 with a heart  rate of 140 and respiratory rate of 22, and O2 saturation of 99% on room  air.  He was afebrile.  He received IV morphine with some improvement in his  chest pain, and patient was actually chest pain free at time of my  evaluation.  The patient denies any history of dysuria, frequency of  micturition, abdominal pain, black or bloody stools.   PAST MEDICAL HISTORY:  1. Migraine headaches.  2. Depression.  3. He denies any prior history of hypertension, diabetes, high cholesterol,     or heart disease.  4. The patient is on Lexapro.  5. He is allergic to PENICILLIN.  6. He drinks about 2-3 cans of beer daily.  7. He used to smoke one had a half to two packs of cigarettes a day for more     than 17 years and quit recently for about three weeks.   FAMILY HISTORY:  He denies any family history of coronary artery disease.   REVIEW OF SYSTEMS:  Systemic inquiry was completely reviewed with patient,  and he denies any significant systemic symptomatology apart from  as noted  above for significant positives and negatives.  Otherwise, unremarkable.   PHYSICAL EXAMINATION:  VITAL SIGNS:  Temperature 96.9 degrees, BP 110/77,  pulse rate 75, respiratory rate 18, O2 saturations 98% on room air.  GENERAL:  A white male gentleman.  He was not in acute cardiopulmonary  distress.  Actually, the patient was chest pain free.  HEENT:  Not icteric.  NECK:  Supple.  No JVD was appreciated.  LUNGS:  Clear to auscultation.  CARDIAC:  Unremarkable.  Regular rate and rhythm without any gallops.  ABDOMEN:  Nontender with normal bowel sounds.  EXTREMITIES:  There was no pitting pedal edema.  He did not have any calf  tenderness or swelling.  NEUROLOGIC:  He was alert and oriented x3.   LABORATORY DATA:  X-rays showed no acute infiltrate.  There was no  cardiomegaly.  12-lead EKG shows sinus rhythm at about 100 beats per minute  without any acute ST wave changes.  CT scan was done by the ED physician to  rule out PE, and there was no evidence of pulmonary embolism.  There was no  evidence of DVT as well per CAT scan.  Blood draws notable for WBC count  7.1, hemoglobin 16.5, hematocrit 47.1, MCV 88.5, platelet count 244.   Sodium  135, calcium 3.9, chloride 109, CO2 of 20, glucose 97, BUN 7, creatinine  1.1, calcium 10.0.  Initial total CK 206, MB 2.3, troponin I less than 0.1.   ASSESSMENT:  A 50 year old gentleman with coronary artery disease risk  factors of male sex and history of cigarette smoking, quit about three weeks  ago, who presented with chest pain.  The chest pain sounds atypical for  ischemia.  However, we are admitting the patient to a telemetry bed, rule  out myocardial infarction.  If patient rules out, we will obtain stress test  in the morning prior to discharge.  The patient will be given supportive  care including oxygen therapy.  He has already received aspirin in the  emergency department and is chest pain free, and he will continued on  aspirin daily for now.                                               Jackie Plum, M.D.    GO/MEDQ  D:  01/06/2003  T:  01/06/2003  Job:  [id number]   cc:   Meredith Staggers, M.D.  [address], [address]  [place]  [place] [address]  Fax: [phone]

## 2011-06-20 ENCOUNTER — Other Ambulatory Visit (INDEPENDENT_AMBULATORY_CARE_PROVIDER_SITE_OTHER): Payer: BC Managed Care – PPO

## 2011-06-20 ENCOUNTER — Encounter: Payer: Self-pay | Admitting: Internal Medicine

## 2011-06-20 ENCOUNTER — Ambulatory Visit (INDEPENDENT_AMBULATORY_CARE_PROVIDER_SITE_OTHER): Payer: BC Managed Care – PPO | Admitting: Internal Medicine

## 2011-06-20 VITALS — BP 128/82 | HR 89 | Temp 97.4°F | Resp 16 | Ht 69.0 in | Wt 159.0 lb

## 2011-06-20 DIAGNOSIS — Z0000 Encounter for general adult medical examination without abnormal findings: Secondary | ICD-10-CM

## 2011-06-20 DIAGNOSIS — Z125 Encounter for screening for malignant neoplasm of prostate: Secondary | ICD-10-CM

## 2011-06-20 LAB — URINALYSIS, ROUTINE W REFLEX MICROSCOPIC
Bilirubin Urine: NEGATIVE
Ketones, ur: NEGATIVE
Leukocytes, UA: NEGATIVE
Urobilinogen, UA: 0.2 (ref 0.0–1.0)
pH: 6.5 (ref 5.0–8.0)

## 2011-06-20 LAB — CBC WITH DIFFERENTIAL/PLATELET
Basophils Relative: 0.6 % (ref 0.0–3.0)
Eosinophils Absolute: 0.2 10*3/uL (ref 0.0–0.7)
HCT: 43.2 % (ref 39.0–52.0)
Lymphs Abs: 2.4 10*3/uL (ref 0.7–4.0)
MCHC: 33.7 g/dL (ref 30.0–36.0)
MCV: 92.1 fl (ref 78.0–100.0)
Monocytes Absolute: 0.5 10*3/uL (ref 0.1–1.0)
Neutrophils Relative %: 46.6 % (ref 43.0–77.0)
RBC: 4.69 Mil/uL (ref 4.22–5.81)

## 2011-06-20 LAB — LIPID PANEL
LDL Cholesterol: 118 mg/dL — ABNORMAL HIGH (ref 0–99)
VLDL: 31.6 mg/dL (ref 0.0–40.0)

## 2011-06-20 LAB — COMPREHENSIVE METABOLIC PANEL
AST: 26 U/L (ref 0–37)
Albumin: 4.5 g/dL (ref 3.5–5.2)
Alkaline Phosphatase: 18 U/L — ABNORMAL LOW (ref 39–117)
BUN: 14 mg/dL (ref 6–23)
Creatinine, Ser: 1.2 mg/dL (ref 0.4–1.5)
Potassium: 5 mEq/L (ref 3.5–5.1)
Total Bilirubin: 0.4 mg/dL (ref 0.3–1.2)

## 2011-06-20 LAB — TSH: TSH: 1.47 u[IU]/mL (ref 0.35–5.50)

## 2011-06-20 LAB — PSA: PSA: 2.02 ng/mL (ref 0.10–4.00)

## 2011-06-20 NOTE — Patient Instructions (Signed)
Health Maintenance, Males A healthy lifestyle and preventative care can promote health and wellness.  Maintain regular health, dental, and eye exams.   Eat a healthy diet. Foods like vegetables, fruits, whole grains, low-fat dairy products, and lean protein foods contain the nutrients you need without too many calories. Decrease your intake of foods high in solid fats, added sugars, and salt. Get information about a proper diet from your caregiver, if necessary.   Regular physical exercise is one of the most important things you can do for your health. Most adults should get at least 150 minutes of moderate-intensity exercise (any activity that increases your heart rate and causes you to sweat) each week. In addition, most adults need muscle-strengthening exercises on 2 or more days a week.    Maintain a healthy weight. The body mass index (BMI) is a screening tool to identify possible weight problems. It provides an estimate of body fat based on height and weight. Your caregiver can help determine your BMI, and can help you achieve or maintain a healthy weight. For adults 20 years and older:   A BMI below 18.5 is considered underweight.   A BMI of 18.5 to 24.9 is normal.   A BMI of 25 to 29.9 is considered overweight.   A BMI of 30 and above is considered obese.   Maintain normal blood lipids and cholesterol by exercising and minimizing your intake of saturated fat. Eat a balanced diet with plenty of fruits and vegetables. Blood tests for lipids and cholesterol should begin at age 20 and be repeated every 5 years. If your lipid or cholesterol levels are high, you are over 50, or you are a high risk for heart disease, you may need your cholesterol levels checked more frequently.Ongoing high lipid and cholesterol levels should be treated with medicines, if diet and exercise are not effective.   If you smoke, find out from your caregiver how to quit. If you do not use tobacco, do not start.    If you choose to drink alcohol, do not exceed 2 drinks per day. One drink is considered to be 12 ounces (355 mL) of beer, 5 ounces (148 mL) of wine, or 1.5 ounces (44 mL) of liquor.   Avoid use of street drugs. Do not share needles with anyone. Ask for help if you need support or instructions about stopping the use of drugs.   High blood pressure causes heart disease and increases the risk of stroke. Blood pressure should be checked at least every 1 to 2 years. Ongoing high blood pressure should be treated with medicines if weight loss and exercise are not effective.   If you are 45 to 51 years old, ask your caregiver if you should take aspirin to prevent heart disease.   Diabetes screening involves taking a blood sample to check your fasting blood sugar level. This should be done once every 3 years, after age 45, if you are within normal weight and without risk factors for diabetes. Testing should be considered at a younger age or be carried out more frequently if you are overweight and have at least 1 risk factor for diabetes.   Colorectal cancer can be detected and often prevented. Most routine colorectal cancer screening begins at the age of 50 and continues through age 75. However, your caregiver may recommend screening at an earlier age if you have risk factors for colon cancer. On a yearly basis, your caregiver may provide home test kits to check for hidden   blood in the stool. Use of a small camera at the end of a tube, to directly examine the colon (sigmoidoscopy or colonoscopy), can detect the earliest forms of colorectal cancer. Talk to your caregiver about this at age 50, when routine screening begins. Direct examination of the colon should be repeated every 5 to 10 years through age 75, unless early forms of pre-cancerous polyps or small growths are found.   Hepatitis C blood testing is recommended for all people born from 1945 through 1965 and any individual with known risks for  hepatitis C.   Healthy men should no longer receive prostate-specific antigen (PSA) blood tests as part of routine cancer screening. Consult with your caregiver about prostate cancer screening.   Testicular cancer screening is not recommended for adolescents or adult males who have no symptoms. Screening includes self-exam, caregiver exam, and other screening tests. Consult with your caregiver about any symptoms you have or any concerns you have about testicular cancer.   Practice safe sex. Use condoms and avoid high-risk sexual practices to reduce the spread of sexually transmitted infections (STIs).   Use sunscreen with a sun protection factor (SPF) of 30 or greater. Apply sunscreen liberally and repeatedly throughout the day. You should seek shade when your shadow is shorter than you. Protect yourself by wearing long sleeves, pants, a wide-brimmed hat, and sunglasses year round, whenever you are outdoors.   Notify your caregiver of new moles or changes in moles, especially if there is a change in shape or color. Also notify your caregiver if a mole is larger than the size of a pencil eraser.   A one-time screening for abdominal aortic aneurysm (AAA) and surgical repair of large AAAs by sound wave imaging (ultrasonography) is recommended for ages 65 to 75 years who are current or former smokers.   Stay current with your immunizations.  Document Released: 09/08/2007 Document Revised: 03/01/2011 Document Reviewed: 08/07/2010 Exit[employer] Patient Information 2012 [employer]. 

## 2011-06-20 NOTE — Assessment & Plan Note (Signed)
Exam done, labs ordered, he was asked to get a colonoscopy done, pt ed material was given

## 2011-06-20 NOTE — Progress Notes (Signed)
  Subjective:    Patient ID: Colton Andersen, male    DOB: 11-22-1960, 50 y.o.   MRN: [id number]  HPI He returns for a complete physical and he offers no complaints.   Review of Systems  Constitutional: Negative.   HENT: Negative.   Eyes: Negative.   Respiratory: Negative.   Cardiovascular: Negative.   Gastrointestinal: Negative.   Genitourinary: Negative.   Musculoskeletal: Negative.   Skin: Negative.   Neurological: Negative.   Hematological: Negative.   Psychiatric/Behavioral: Negative.        Objective:   Physical Exam  Vitals reviewed. Constitutional: He is oriented to person, place, and time. He appears well-developed and well-nourished. No distress.  HENT:  Head: Normocephalic and atraumatic.  Mouth/Throat: Oropharynx is clear and moist. No oropharyngeal exudate.  Eyes: Conjunctivae are normal. Right eye exhibits no discharge. Left eye exhibits no discharge. No scleral icterus.  Neck: Normal range of motion. Neck supple. No JVD present. No tracheal deviation present. No thyromegaly present.  Cardiovascular: Normal rate, regular rhythm, normal heart sounds and intact distal pulses.  Exam reveals no gallop and no friction rub.   No murmur heard. Pulmonary/Chest: Effort normal and breath sounds normal. No stridor. No respiratory distress. He has no wheezes. He has no rales. He exhibits no tenderness.  Abdominal: Soft. Bowel sounds are normal. He exhibits no distension and no mass. There is no tenderness. There is no rebound and no guarding. Hernia confirmed negative in the right inguinal area and confirmed negative in the left inguinal area.  Genitourinary: Rectum normal, prostate normal, testes normal and penis normal. Rectal exam shows no external hemorrhoid, no internal hemorrhoid, no fissure, no mass, no tenderness and anal tone normal. Guaiac negative stool. Prostate is not enlarged and not tender. Right testis shows no mass, no swelling and no tenderness. Right testis is  descended. Left testis shows no mass, no swelling and no tenderness. Left testis is descended. Circumcised. No penile tenderness. No discharge found.  Musculoskeletal: Normal range of motion. He exhibits no edema and no tenderness.  Lymphadenopathy:    He has no cervical adenopathy.       Right: No inguinal adenopathy present.       Left: No inguinal adenopathy present.  Neurological: He is oriented to person, place, and time.  Skin: Skin is warm and dry. No rash noted. He is not diaphoretic. No erythema. No pallor.  Psychiatric: He has a normal mood and affect. His behavior is normal. Judgment and thought content normal.      Lab Results  Component Value Date   WBC 4.9 02/08/2010   HGB 14.1 02/08/2010   HCT 41.0 02/08/2010   PLT 221 02/08/2010   GLUCOSE 101* 02/08/2010   CHOL 206* 11/08/2008   TRIG 90.0 11/08/2008   HDL 40.30 11/08/2008   LDLDIRECT 153.2 11/08/2008   ALT 42 11/08/2008   AST 32 11/08/2008   NA 138 02/08/2010   K 4.5 02/08/2010   CL 105 02/08/2010   CREATININE 0.95 02/08/2010   BUN 10 02/08/2010   CO2 28 02/08/2010   TSH 1.05 06/28/2008   PSA 1.15 12/22/2007   HGBA1C 5.5 06/28/2008      Assessment & Plan:

## 2011-06-25 ENCOUNTER — Encounter: Payer: Self-pay | Admitting: Internal Medicine

## 2011-07-20 ENCOUNTER — Ambulatory Visit (AMBULATORY_SURGERY_CENTER): Payer: BC Managed Care – PPO | Admitting: *Deleted

## 2011-07-20 VITALS — Ht 69.0 in | Wt 155.0 lb

## 2011-07-20 DIAGNOSIS — Z1211 Encounter for screening for malignant neoplasm of colon: Secondary | ICD-10-CM

## 2011-07-20 MED ORDER — PEG-KCL-NACL-NASULF-NA ASC-C 100 G PO SOLR: ORAL | Status: DC

## 2011-07-23 ENCOUNTER — Encounter: Payer: Self-pay | Admitting: Gastroenterology

## 2011-08-01 LAB — HM COLONOSCOPY

## 2011-08-03 ENCOUNTER — Other Ambulatory Visit: Payer: BC Managed Care – PPO | Admitting: Internal Medicine

## 2011-08-03 ENCOUNTER — Ambulatory Visit (AMBULATORY_SURGERY_CENTER): Payer: BC Managed Care – PPO | Admitting: Gastroenterology

## 2011-08-03 ENCOUNTER — Encounter: Payer: Self-pay | Admitting: Gastroenterology

## 2011-08-03 VITALS — BP 146/91 | HR 85 | Temp 96.0°F | Resp 19 | Ht 69.0 in | Wt 155.0 lb

## 2011-08-03 DIAGNOSIS — D126 Benign neoplasm of colon, unspecified: Secondary | ICD-10-CM

## 2011-08-03 DIAGNOSIS — Z1211 Encounter for screening for malignant neoplasm of colon: Secondary | ICD-10-CM

## 2011-08-03 MED ORDER — SODIUM CHLORIDE 0.9 % IV SOLN: 500.0000 mL | INTRAVENOUS | Status: DC

## 2011-08-03 NOTE — Progress Notes (Signed)
PROPOFOL PER K ROGERS CRNA. SEE SCANNED INTRA PROCEDURE REPORT. EWM 

## 2011-08-03 NOTE — Op Note (Signed)
[facility] [address] [place], [place]  [address]  COLONOSCOPY PROCEDURE REPORT  PATIENT:  Colton Andersen, Colton Andersen  MR#:  [id number] BIRTHDATE:  07-Apr-1960, 51 yrs. old  GENDER:  male ENDOSCOPIST:  Vania Rea. Jarold Motto, MD, [facility] REF. BY:  Etta Grandchild, M.D. PROCEDURE DATE:  08/03/2011 PROCEDURE:  Colonoscopy with snare polypectomy ASA CLASS:  Class II INDICATIONS:  Routine Risk Screening MEDICATIONS:   propofol (Diprivan) 230 mg IV  DESCRIPTION OF PROCEDURE:   After the risks and benefits and of the procedure were explained, informed consent was obtained. Digital rectal exam was performed and revealed no abnormalities. The LB CF-[id number] endoscope was introduced through the anus and advanced to the cecum, which was identified by both the appendix and ileocecal valve.  The quality of the prep was excellent, using MoviPrep.  The instrument was then slowly withdrawn as the colon was fully examined. <<PROCEDUREIMAGES>>  FINDINGS:  There were mild diverticular changes in left colon. diverticulosis was found.  Multiple sessile polyps were found. 5 mm sessile sigmoid polyp hot snare excised.   Retroflexed views in the rectum revealed no abnormalities.    The scope was then withdrawn from the patient and the procedure completed.  COMPLICATIONS:  None ENDOSCOPIC IMPRESSION: 1) Diverticulosis,mild,left sided diverticulosis 2) Sessile polyp, multiple R/O ADENOMA RECOMMENDATIONS: 1) Await biopsy results 2) Repeat colonoscopy in 5 years if polyp adenomatous; otherwise 10 years 3) High fiber diet  REPEAT EXAM:  No  ______________________________ Vania Rea. Jarold Motto, MD, [facility]  CC:  n. eSIGNED:   Vania Rea. Latrise Bowland at 08/03/2011 03:51 PM  Anise Salvo, [phone]

## 2011-08-03 NOTE — Patient Instructions (Signed)

## 2011-08-03 NOTE — Progress Notes (Signed)
Patient did not experience any of the following events: a burn prior to discharge; a fall within the facility; wrong site/side/patient/procedure/implant event; or a hospital transfer or hospital admission upon discharge from the facility. (G8907) Patient did not have preoperative order for IV antibiotic SSI prophylaxis. (G8918)  

## 2011-08-06 ENCOUNTER — Telehealth: Payer: Self-pay | Admitting: *Deleted

## 2011-08-06 NOTE — Telephone Encounter (Signed)
  Follow up Call-  Call back number 08/03/2011  Post procedure Call Back phone  # [phone]  Permission to leave phone message Yes     Patient questions:  Do you have a fever, pain , or abdominal swelling? no Pain Score  0 *  Have you tolerated food without any problems? yes  Have you been able to return to your normal activities? yes  Do you have any questions about your discharge instructions: Diet   no Medications  no Follow up visit  no  Do you have questions or concerns about your Care? no  Actions: * If pain score is 4 or above: No action needed, pain <4.

## 2011-08-09 ENCOUNTER — Encounter: Payer: Self-pay | Admitting: Gastroenterology

## 2012-07-16 ENCOUNTER — Other Ambulatory Visit: Payer: Self-pay | Admitting: Orthopedic Surgery

## 2012-07-16 DIAGNOSIS — M545 Low back pain, unspecified: Secondary | ICD-10-CM

## 2012-07-23 ENCOUNTER — Ambulatory Visit
Admission: RE | Admit: 2012-07-23 | Discharge: 2012-07-23 | Disposition: A | Discharge status: Home / Self Care | Payer: BC Managed Care – PPO | Source: Ambulatory Visit | Attending: Orthopedic Surgery | Admitting: Orthopedic Surgery

## 2012-07-23 ENCOUNTER — Other Ambulatory Visit: Payer: Self-pay | Admitting: Orthopedic Surgery

## 2012-07-23 VITALS — BP 132/86 | HR 78 | Temp 96.2°F

## 2012-07-23 DIAGNOSIS — M545 Low back pain, unspecified: Secondary | ICD-10-CM

## 2012-07-23 MED ORDER — FENTANYL CITRATE 0.05 MG/ML IJ SOLN
25.0000 ug | INTRAMUSCULAR | Status: DC | PRN
  Administered 2012-07-23: 100 ug via INTRAVENOUS

## 2012-07-23 MED ORDER — VANCOMYCIN HCL IN DEXTROSE 1-5 GM/200ML-% IV SOLN
1000.0000 mg | Freq: Once | INTRAVENOUS | Status: AC
  Administered 2012-07-23: 1000 mg via INTRAVENOUS

## 2012-07-23 MED ORDER — IOHEXOL 180 MG/ML  SOLN
5.5000 mL | Freq: Once | INTRAMUSCULAR | Status: AC | PRN
  Administered 2012-07-23: 5.5 mL

## 2012-07-23 MED ORDER — KETOROLAC TROMETHAMINE 30 MG/ML IJ SOLN
30.0000 mg | Freq: Once | INTRAMUSCULAR | Status: AC
  Administered 2012-07-23: 30 mg via INTRAVENOUS

## 2012-07-23 MED ORDER — IOHEXOL 180 MG/ML  SOLN: 5.0000 mL | Freq: Once | INTRAMUSCULAR | Status: DC | PRN

## 2012-07-23 MED ORDER — MEPERIDINE HCL 100 MG/ML IJ SOLN
75.0000 mg | Freq: Once | INTRAMUSCULAR | Status: AC
  Administered 2012-07-23: 75 mg via INTRAMUSCULAR

## 2012-07-23 MED ORDER — SODIUM CHLORIDE 0.9 % IV SOLN
Freq: Once | INTRAVENOUS | Status: AC
  Administered 2012-07-23: 08:00:00 via INTRAVENOUS

## 2012-07-23 MED ORDER — ONDANSETRON HCL 4 MG/2ML IJ SOLN
4.0000 mg | Freq: Once | INTRAMUSCULAR | Status: AC
  Administered 2012-07-23: 4 mg via INTRAMUSCULAR

## 2012-07-23 MED ORDER — MIDAZOLAM HCL 2 MG/2ML IJ SOLN
1.0000 mg | INTRAMUSCULAR | Status: DC | PRN
  Administered 2012-07-23 (×2): 1 mg via INTRAVENOUS

## 2012-07-23 NOTE — Progress Notes (Signed)
Heart sounds regular and lungs clear bilaterally.

## 2012-09-01 ENCOUNTER — Other Ambulatory Visit (INDEPENDENT_AMBULATORY_CARE_PROVIDER_SITE_OTHER): Payer: BC Managed Care – PPO

## 2012-09-01 ENCOUNTER — Ambulatory Visit (INDEPENDENT_AMBULATORY_CARE_PROVIDER_SITE_OTHER): Payer: BC Managed Care – PPO | Admitting: Internal Medicine

## 2012-09-01 ENCOUNTER — Encounter: Payer: Self-pay | Admitting: Internal Medicine

## 2012-09-01 VITALS — BP 122/66 | HR 81 | Temp 98.0°F | Resp 16 | Wt 149.1 lb

## 2012-09-01 DIAGNOSIS — B182 Chronic viral hepatitis C: Secondary | ICD-10-CM

## 2012-09-01 LAB — PROTIME-INR: Prothrombin Time: 11 s (ref 10.2–12.4)

## 2012-09-01 NOTE — Patient Instructions (Signed)
Hepatitis C °Hepatitis C is a viral infection of the liver. Infection may go undetected for months or years because symptoms may be absent or very mild. Chronic liver disease is the main danger of hepatitis C. This may lead to scarring of the liver (cirrhosis), liver failure, and liver cancer. °CAUSES  °Hepatitis C is caused by the hepatitis C virus (HCV). Formerly, hepatitis C infections were most commonly transmitted through blood transfusions. In the early 1990s, routine testing of donated blood for hepatitis C and exclusion of blood that tests positive for HCV began. Now, HCV is most commonly transmitted from person to person through injection drug use, sharing needles, or sex with an infected person. A caregiver may also get the infection from exposure to the blood of an infected patient by way of a cut or needle stick.  °SYMPTOMS  °Acute Phase °Many cases of acute HCV infection are mild and cause few problems. Some people may not even realize they are sick. Symptoms in others may last a few weeks to several months and include: °· Feeling very tired. °· Loss of appetite. °· Nausea. °· Vomiting. °· Abdominal pain. °· Dark yellow urine. °· Yellow skin and eyes (jaundice). °· Itching of the skin. °Chronic Phase °· Between 50% to 85% of people who get HCV infection become "chronic carriers." They often have no symptoms, but the virus stays in their body. They may spread the virus to others and can get long-term liver disease. °· Many people with chronic HCV infection remain healthy for many years. However, up to 1 in 5 chronically infected people may develop severe liver diseases including scarring of the liver (cirrhosis), liver failure, or liver cancer. °DIAGNOSIS  °Diagnosis of hepatitis C infection is made by testing blood for the presence of hepatitis C viral particles called RNA. Other tests may also be done to measure the status of current liver function, exclude other liver problems, or assess liver  damage. °TREATMENT  °Treatment with many antiviral drugs is available and recommended for some patients with chronic HCV infection. Drug treatment is generally considered appropriate for patients who: °· Are 18 years of age or older. °· Have a positive test for HCV particles in the blood. °· Have a liver tissue sample (biopsy) that shows chronic hepatitis and significant scarring (fibrosis). °· Do not have signs of liver failure. °· Have acceptable blood test results that confirm the wellness of other body organs. °· Are willing to be treated and conform to treatment requirements. °· Have no other circumstances that would prevent treatment from being recommended (contraindications). °All people who are offered and choose to receive drug treatment must understand that careful medical follow up for many months and even years is crucial in order to make successful care possible. The goal of drug treatment is to eliminate any evidence of HCV in the blood on a long-term basis. This is called a "sustained virologic response" or SVR. Achieving a SVR is associated with a decrease in the chance of life-threatening liver problems, need for a liver transplant, liver cancer rates, and liver-related complications. °Successful treatment currently requires taking treatment drugs for at least 24 weeks and up to 72 weeks. An injected drug (interferon) given weekly and an oral antiviral medicine taken daily are usually prescribed. Side effects from these drugs are common and some may be very serious. Your response to treatment must be carefully monitored by both you and your caregiver throughout the entire treatment period. °PREVENTION °There is no vaccine for hepatitis C. The only   way to prevent the disease is to reduce the risk of exposure to the virus.  °· Avoid sharing drug needles or personal items like toothbrushes, razors, and nail clippers with an infected person. °· Healthcare workers need to avoid injuries and wear  appropriate protective equipment such as gloves, gowns, and face masks when performing invasive medical or nursing procedures. °HOME CARE INSTRUCTIONS  °To avoid making your liver disease worse: °· Strictly avoid drinking alcohol. °· Carefully review all new prescriptions of medicines with your caregiver. Ask your caregiver which drugs you should avoid. The following drugs are toxic to the liver, and your caregiver may tell you to avoid them: °· Isoniazid. °· Methyldopa. °· Acetaminophen. °· Anabolic steroids (muscle-building drugs). °· Erythromycin. °· Oral contraceptives (birth control pills). °· Check with your caregiver to make sure medicine you are currently taking will not be harmful. °· Periodic blood tests may be required. Follow your caregiver's advice about when you should have blood tests. °· Avoid a sexual relationship until advised otherwise by your caregiver. °· Avoid activities that could expose other people to your blood. Examples include sharing a toothbrush, nail clippers, razors, and needles. °· Bed rest is not necessary, but it may make you feel better. Recovery time is not related to the amount of rest you receive. °· This infection is contagious. Follow your caregiver's instructions in order to avoid spread of the infection. °SEEK IMMEDIATE MEDICAL CARE IF: °· You have increasing fatigue or weakness. °· You have an oral temperature above 102° F (38.9° C), not controlled by medicine. °· You develop loss of appetite, nausea, or vomiting. °· You develop jaundice. °· You develop easy bruising or bleeding. °· You develop any severe problems as a result of your treatment. °MAKE SURE YOU:  °· Understand these instructions. °· Will watch your condition. °· Will get help right away if you are not doing well or get worse. °Document Released: 03/09/2000 Document Revised: 06/04/2011 Document Reviewed: 07/12/2010 °Exit[employer]® Patient Information ©2014 [employer]. ° °

## 2012-09-01 NOTE — Progress Notes (Signed)
  Subjective:    Patient ID: Colton Andersen, male    DOB: 1960/07/27, 52 y.o.   MRN: [id number]  HPI  He returns for evaluation after recently finding that he has Hep C ( see scanned labs from rheumatology. )  Review of Systems  Constitutional: Negative.  Negative for fever, chills, diaphoresis and fatigue.  HENT: Negative.   Eyes: Negative.   Respiratory: Negative.   Cardiovascular: Negative.   Gastrointestinal: Negative.  Negative for nausea, vomiting, abdominal pain, diarrhea and constipation.  Endocrine: Negative.   Genitourinary: Negative.   Musculoskeletal: Positive for back pain and arthralgias. Negative for myalgias, joint swelling and gait problem.  Skin: Negative.   Allergic/Immunologic: Negative.   Neurological: Negative.   Hematological: Negative.   Psychiatric/Behavioral: Negative.        Objective:   Physical Exam  Vitals reviewed. Constitutional: He is oriented to person, place, and time. He appears well-developed and well-nourished. No distress.  HENT:  Head: Normocephalic and atraumatic.  Mouth/Throat: Oropharynx is clear and moist. No oropharyngeal exudate.  Eyes: Conjunctivae are normal. Right eye exhibits no discharge. Left eye exhibits no discharge. No scleral icterus.  Neck: Normal range of motion. Neck supple. No JVD present. No tracheal deviation present. No thyromegaly present.  Cardiovascular: Normal rate, regular rhythm, normal heart sounds and intact distal pulses.  Exam reveals no gallop and no friction rub.   No murmur heard. Pulmonary/Chest: Effort normal and breath sounds normal. No stridor. No respiratory distress. He has no wheezes. He has no rales. He exhibits no tenderness.  Abdominal: Soft. Bowel sounds are normal. He exhibits no distension and no mass. There is no tenderness. There is no rebound and no guarding.  Musculoskeletal: Normal range of motion. He exhibits no edema and no tenderness.  Lymphadenopathy:    He has no cervical  adenopathy.  Neurological: He is oriented to person, place, and time.  Skin: Skin is warm and dry. No rash noted. He is not diaphoretic. No erythema. No pallor.  Psychiatric: He has a normal mood and affect. His behavior is normal. Judgment and thought content normal.    Lab Results  Component Value Date   WBC 5.9 06/20/2011   HGB 14.6 06/20/2011   HCT 43.2 06/20/2011   PLT 221.0 06/20/2011   GLUCOSE 99 06/20/2011   CHOL 191 06/20/2011   TRIG 158.0* 06/20/2011   HDL 41.00 06/20/2011   LDLDIRECT 153.2 11/08/2008   LDLCALC 118* 06/20/2011   ALT 44 06/20/2011   AST 26 06/20/2011   NA 142 06/20/2011   K 5.0 06/20/2011   CL 106 06/20/2011   CREATININE 1.2 06/20/2011   BUN 14 06/20/2011   CO2 30 06/20/2011   TSH 1.47 06/20/2011   PSA 2.02 06/20/2011   HGBA1C 5.5 06/28/2008        Assessment & Plan:

## 2012-09-01 NOTE — Assessment & Plan Note (Signed)
Will refer to Hep C clinic - I question the need for a liver biopsy I will check her HCC with an AFP and U/S Will vaccinate for Hep A and B if ab titers are negative He was given pt ed material

## 2012-09-02 ENCOUNTER — Telehealth: Payer: Self-pay | Admitting: Internal Medicine

## 2012-09-02 LAB — AFP TUMOR MARKER: AFP-Tumor Marker: 5.9 ng/mL (ref 0.0–8.0)

## 2012-09-02 LAB — HEPATITIS A ANTIBODY, TOTAL: Hep A Total Ab: NEGATIVE

## 2012-09-02 NOTE — Telephone Encounter (Signed)
His type is 1a

## 2012-09-02 NOTE — Telephone Encounter (Signed)
Need hepatitis C genotypes results before they can  See this pt per [facility] have this been done?  C ant find the report in the system  pls advise .

## 2012-09-03 ENCOUNTER — Encounter: Payer: Self-pay | Admitting: Internal Medicine

## 2012-09-03 ENCOUNTER — Ambulatory Visit
Admission: RE | Admit: 2012-09-03 | Discharge: 2012-09-03 | Disposition: A | Discharge status: Home / Self Care | Payer: BC Managed Care – PPO | Source: Ambulatory Visit | Attending: Internal Medicine | Admitting: Internal Medicine

## 2012-09-03 DIAGNOSIS — B182 Chronic viral hepatitis C: Secondary | ICD-10-CM

## 2012-09-04 ENCOUNTER — Ambulatory Visit: Payer: BC Managed Care – PPO | Admitting: Internal Medicine

## 2013-01-29 ENCOUNTER — Other Ambulatory Visit: Payer: Self-pay

## 2013-06-05 ENCOUNTER — Encounter: Payer: Self-pay | Admitting: Internal Medicine

## 2013-06-05 ENCOUNTER — Ambulatory Visit (INDEPENDENT_AMBULATORY_CARE_PROVIDER_SITE_OTHER): Payer: BC Managed Care – PPO | Admitting: Internal Medicine

## 2013-06-05 ENCOUNTER — Other Ambulatory Visit (INDEPENDENT_AMBULATORY_CARE_PROVIDER_SITE_OTHER): Payer: BC Managed Care – PPO

## 2013-06-05 VITALS — BP 138/70 | HR 96 | Temp 97.1°F | Resp 16 | Ht 69.0 in | Wt 157.0 lb

## 2013-06-05 DIAGNOSIS — Z0000 Encounter for general adult medical examination without abnormal findings: Secondary | ICD-10-CM

## 2013-06-05 LAB — COMPREHENSIVE METABOLIC PANEL
ALBUMIN: 4.2 g/dL (ref 3.5–5.2)
ALT: 47 U/L (ref 0–53)
AST: 33 U/L (ref 0–37)
Alkaline Phosphatase: 21 U/L — ABNORMAL LOW (ref 39–117)
BUN: 13 mg/dL (ref 6–23)
CALCIUM: 9.6 mg/dL (ref 8.4–10.5)
CHLORIDE: 103 meq/L (ref 96–112)
CO2: 28 mEq/L (ref 19–32)
Creatinine, Ser: 1.2 mg/dL (ref 0.4–1.5)
GFR: 66.66 mL/min (ref >60.00)
GLUCOSE: 85 mg/dL (ref 70–99)
POTASSIUM: 4 meq/L (ref 3.5–5.1)
SODIUM: 140 meq/L (ref 135–145)
TOTAL PROTEIN: 7.3 g/dL (ref 6.0–8.3)
Total Bilirubin: 0.5 mg/dL (ref 0.3–1.2)

## 2013-06-05 LAB — CBC WITH DIFFERENTIAL/PLATELET
BASOS PCT: 0.6 % (ref 0.0–3.0)
Basophils Absolute: 0 10*3/uL (ref 0.0–0.1)
EOS PCT: 3.5 % (ref 0.0–5.0)
Eosinophils Absolute: 0.2 10*3/uL (ref 0.0–0.7)
HCT: 40.2 % (ref 39.0–52.0)
Hemoglobin: 13.7 g/dL (ref 13.0–17.0)
LYMPHS PCT: 37.3 % (ref 12.0–46.0)
Lymphs Abs: 2.6 10*3/uL (ref 0.7–4.0)
MCHC: 34 g/dL (ref 30.0–36.0)
MCV: 90 fl (ref 78.0–100.0)
MONO ABS: 0.6 10*3/uL (ref 0.1–1.0)
Monocytes Relative: 8.3 % (ref 3.0–12.0)
NEUTROS PCT: 50.3 % (ref 43.0–77.0)
Neutro Abs: 3.5 10*3/uL (ref 1.4–7.7)
PLATELETS: 260 10*3/uL (ref 150.0–400.0)
RBC: 4.46 Mil/uL (ref 4.22–5.81)
RDW: 14.2 % (ref 11.5–14.6)
WBC: 6.9 10*3/uL (ref 4.5–10.5)

## 2013-06-05 LAB — URINALYSIS, ROUTINE W REFLEX MICROSCOPIC
BILIRUBIN URINE: NEGATIVE
HGB URINE DIPSTICK: NEGATIVE
KETONES UR: NEGATIVE
Leukocytes, UA: NEGATIVE
Nitrite: NEGATIVE
RBC / HPF: NONE SEEN (ref 0–?)
Specific Gravity, Urine: 1.02 (ref 1.000–1.030)
TOTAL PROTEIN, URINE-UPE24: NEGATIVE
URINE GLUCOSE: NEGATIVE
Urobilinogen, UA: 0.2 (ref 0.0–1.0)
pH: 6 (ref 5.0–8.0)

## 2013-06-05 LAB — LIPID PANEL
Cholesterol: 193 mg/dL (ref 0–200)
HDL: 49.5 mg/dL (ref >39.00)
LDL Cholesterol: 127 mg/dL — ABNORMAL HIGH (ref 0–99)
TRIGLYCERIDES: 83 mg/dL (ref 0.0–149.0)
Total CHOL/HDL Ratio: 4
VLDL: 16.6 mg/dL (ref 0.0–40.0)

## 2013-06-05 LAB — PSA: PSA: 1.67 ng/mL (ref 0.10–4.00)

## 2013-06-05 NOTE — Patient Instructions (Signed)

## 2013-06-05 NOTE — Progress Notes (Signed)
Pre visit review using our clinic review tool, if applicable. No additional management support is needed unless otherwise documented below in the visit note. 

## 2013-06-07 ENCOUNTER — Encounter: Payer: Self-pay | Admitting: Internal Medicine

## 2013-06-07 NOTE — Progress Notes (Signed)
Subjective:    Patient ID: Colton Andersen, male    DOB: [birth year], 53 y.o.   MRN: [id number]  HPI  He returns for a complete physical and he tells me that he feels well and offers no complaints. He has been seeing a Hep C specialist and, for now, he has decided not to treat the infection.   Review of Systems  Constitutional: Negative.  Negative for fever, chills, diaphoresis, appetite change and fatigue.  HENT: Negative.   Eyes: Negative.   Respiratory: Negative.  Negative for cough, choking, chest tightness, shortness of breath and stridor.   Cardiovascular: Negative.  Negative for chest pain and palpitations.  Gastrointestinal: Negative.  Negative for nausea, abdominal pain, diarrhea, constipation and blood in stool.  Endocrine: Negative.   Genitourinary: Negative.   Musculoskeletal: Negative.   Skin: Negative.   Allergic/Immunologic: Negative.   Neurological: Negative.   Hematological: Negative.  Negative for adenopathy. Does not bruise/bleed easily.  Psychiatric/Behavioral: Negative.   All other systems reviewed and are negative.       Objective:   Physical Exam  Vitals reviewed. Constitutional: He is oriented to person, place, and time. He appears well-developed and well-nourished. No distress.  HENT:  Head: Normocephalic and atraumatic.  Mouth/Throat: Oropharynx is clear and moist. No oropharyngeal exudate.  Eyes: Conjunctivae are normal. Right eye exhibits no discharge. Left eye exhibits no discharge. No scleral icterus.  Neck: Normal range of motion. Neck supple. No JVD present. No tracheal deviation present. No thyromegaly present.  Cardiovascular: Normal rate, regular rhythm, normal heart sounds and intact distal pulses.  Exam reveals no gallop and no friction rub.   No murmur heard. Pulmonary/Chest: Effort normal and breath sounds normal. No stridor. No respiratory distress. He has no wheezes. He has no rales. He exhibits no tenderness.  Abdominal: Soft. Bowel  sounds are normal. He exhibits no distension and no mass. There is no tenderness. There is no rebound and no guarding. Hernia confirmed negative in the right inguinal area and confirmed negative in the left inguinal area.  Genitourinary: Rectum normal, prostate normal, testes normal and penis normal. Rectal exam shows no external hemorrhoid, no internal hemorrhoid, no fissure, no mass, no tenderness and anal tone normal. Guaiac negative stool. Prostate is not enlarged and not tender. Right testis shows no mass, no swelling and no tenderness. Right testis is descended. Left testis shows no swelling and no tenderness. Left testis is descended. Circumcised. No penile erythema or penile tenderness. No discharge found.  Musculoskeletal: Normal range of motion. He exhibits no edema and no tenderness.  Lymphadenopathy:    He has no cervical adenopathy.       Right: No inguinal adenopathy present.       Left: No inguinal adenopathy present.  Neurological: He is oriented to person, place, and time.  Skin: Skin is warm and dry. No rash noted. He is not diaphoretic. No erythema. No pallor.      Lab Results  Component Value Date   WBC 6.9 06/05/2013   HGB 13.7 06/05/2013   HCT 40.2 06/05/2013   PLT 260.0 06/05/2013   GLUCOSE 85 06/05/2013   CHOL 193 06/05/2013   TRIG 83.0 06/05/2013   HDL 49.50 06/05/2013   LDLDIRECT 153.2 11/08/2008   LDLCALC 127* 06/05/2013   ALT 47 06/05/2013   AST 33 06/05/2013   NA 140 06/05/2013   K 4.0 06/05/2013   CL 103 06/05/2013   CREATININE 1.2 06/05/2013   BUN 13 06/05/2013   CO2  28 06/05/2013   TSH 1.47 06/20/2011   PSA 1.67 06/05/2013   INR 1.0 09/01/2012   HGBA1C 5.5 06/28/2008      Assessment & Plan:

## 2013-06-07 NOTE — Assessment & Plan Note (Signed)
Exam done He refused a flu vax Labs ordered Pt ed material was given 

## 2013-10-29 ENCOUNTER — Other Ambulatory Visit: Payer: Self-pay | Admitting: Nurse Practitioner

## 2013-10-29 DIAGNOSIS — B182 Chronic viral hepatitis C: Secondary | ICD-10-CM

## 2013-11-10 ENCOUNTER — Other Ambulatory Visit: Payer: BC Managed Care – PPO

## 2013-11-25 ENCOUNTER — Ambulatory Visit
Admission: RE | Admit: 2013-11-25 | Discharge: 2013-11-25 | Disposition: A | Discharge status: Home / Self Care | Payer: BC Managed Care – PPO | Source: Ambulatory Visit | Attending: Nurse Practitioner | Admitting: Nurse Practitioner

## 2013-11-25 DIAGNOSIS — B182 Chronic viral hepatitis C: Secondary | ICD-10-CM

## 2014-02-09 ENCOUNTER — Telehealth: Payer: Self-pay | Admitting: Internal Medicine

## 2014-02-09 NOTE — Telephone Encounter (Signed)
Wife wants to know if she can drop of CDL/DOT form for PCP to fill out/sign by PCP. Pt did have CPE & labs with Dr Ronnald Ramp March 2015.

## 2014-02-09 NOTE — Telephone Encounter (Signed)
I am not certitified for DOT forms

## 2014-02-10 NOTE — Telephone Encounter (Signed)
Pt aware.

## 2014-03-29 ENCOUNTER — Other Ambulatory Visit (INDEPENDENT_AMBULATORY_CARE_PROVIDER_SITE_OTHER): Payer: BLUE CROSS/BLUE SHIELD

## 2014-03-29 ENCOUNTER — Encounter: Payer: Self-pay | Admitting: Internal Medicine

## 2014-03-29 ENCOUNTER — Ambulatory Visit (INDEPENDENT_AMBULATORY_CARE_PROVIDER_SITE_OTHER): Payer: BLUE CROSS/BLUE SHIELD | Admitting: Internal Medicine

## 2014-03-29 VITALS — BP 130/2 | HR 90 | Temp 97.8°F | Ht 69.0 in | Wt 159.0 lb

## 2014-03-29 DIAGNOSIS — M150 Primary generalized (osteo)arthritis: Secondary | ICD-10-CM

## 2014-03-29 DIAGNOSIS — R791 Abnormal coagulation profile: Secondary | ICD-10-CM

## 2014-03-29 DIAGNOSIS — M8949 Other hypertrophic osteoarthropathy, multiple sites: Secondary | ICD-10-CM

## 2014-03-29 DIAGNOSIS — R040 Epistaxis: Secondary | ICD-10-CM

## 2014-03-29 DIAGNOSIS — M159 Polyosteoarthritis, unspecified: Secondary | ICD-10-CM | POA: Insufficient documentation

## 2014-03-29 DIAGNOSIS — B182 Chronic viral hepatitis C: Secondary | ICD-10-CM

## 2014-03-29 DIAGNOSIS — M5137 Other intervertebral disc degeneration, lumbosacral region: Secondary | ICD-10-CM

## 2014-03-29 LAB — CBC WITH DIFFERENTIAL/PLATELET
BASOS PCT: 0.3 % (ref 0.0–3.0)
Basophils Absolute: 0 10*3/uL (ref 0.0–0.1)
Eosinophils Absolute: 0.2 10*3/uL (ref 0.0–0.7)
Eosinophils Relative: 2 % (ref 0.0–5.0)
HCT: 45.4 % (ref 39.0–52.0)
HEMOGLOBIN: 15.3 g/dL (ref 13.0–17.0)
LYMPHS PCT: 29.2 % (ref 12.0–46.0)
Lymphs Abs: 2.2 10*3/uL (ref 0.7–4.0)
MCHC: 33.6 g/dL (ref 30.0–36.0)
MCV: 90.2 fl (ref 78.0–100.0)
MONOS PCT: 8.7 % (ref 3.0–12.0)
Monocytes Absolute: 0.7 10*3/uL (ref 0.1–1.0)
NEUTROS ABS: 4.5 10*3/uL (ref 1.4–7.7)
NEUTROS PCT: 59.8 % (ref 43.0–77.0)
Platelets: 235 10*3/uL (ref 150.0–400.0)
RBC: 5.04 Mil/uL (ref 4.22–5.81)
RDW: 13.6 % (ref 11.5–15.5)
WBC: 7.5 10*3/uL (ref 4.0–10.5)

## 2014-03-29 LAB — URINALYSIS, ROUTINE W REFLEX MICROSCOPIC
Bilirubin Urine: NEGATIVE
Hgb urine dipstick: NEGATIVE
Ketones, ur: NEGATIVE
LEUKOCYTES UA: NEGATIVE
NITRITE: NEGATIVE
PH: 6 (ref 5.0–8.0)
Specific Gravity, Urine: <=1.005 — AB (ref 1.000–1.030)
Total Protein, Urine: NEGATIVE
UROBILINOGEN UA: 0.2 (ref 0.0–1.0)
Urine Glucose: NEGATIVE

## 2014-03-29 LAB — PROTIME-INR
INR: 1 ratio (ref 0.8–1.0)
Prothrombin Time: 10.9 s (ref 9.6–13.1)

## 2014-03-29 LAB — COMPREHENSIVE METABOLIC PANEL
ALK PHOS: 21 U/L — AB (ref 39–117)
ALT: 44 U/L (ref 0–53)
AST: 30 U/L (ref 0–37)
Albumin: 4.6 g/dL (ref 3.5–5.2)
BILIRUBIN TOTAL: 0.6 mg/dL (ref 0.2–1.2)
BUN: 17 mg/dL (ref 6–23)
CO2: 27 meq/L (ref 19–32)
CREATININE: 1.2 mg/dL (ref 0.4–1.5)
Calcium: 9.9 mg/dL (ref 8.4–10.5)
Chloride: 105 mEq/L (ref 96–112)
GFR: 70.47 mL/min (ref >60.00)
GLUCOSE: 111 mg/dL — AB (ref 70–99)
Potassium: 4.9 mEq/L (ref 3.5–5.1)
SODIUM: 140 meq/L (ref 135–145)
Total Protein: 8.4 g/dL — ABNORMAL HIGH (ref 6.0–8.3)

## 2014-03-29 LAB — APTT: aPTT: 32.9 s — ABNORMAL HIGH (ref 23.4–32.7)

## 2014-03-29 MED ORDER — TRAMADOL HCL 50 MG PO TABS: 50.0000 mg | ORAL_TABLET | Freq: Three times a day (TID) | ORAL | Status: DC | PRN

## 2014-03-29 NOTE — Patient Instructions (Signed)

## 2014-03-30 ENCOUNTER — Encounter: Payer: Self-pay | Admitting: Internal Medicine

## 2014-03-30 DIAGNOSIS — R791 Abnormal coagulation profile: Secondary | ICD-10-CM | POA: Insufficient documentation

## 2014-03-30 NOTE — Assessment & Plan Note (Signed)
No bleeding or bruising noted at this time He will stop asa and all nsaids I will check his labs to screen for coagulopathy

## 2014-03-30 NOTE — Assessment & Plan Note (Signed)
He needs to stop asa and nsaids for now He is concerned about tylenol and the liver Will try tramadol for the joint pain

## 2014-03-30 NOTE — Assessment & Plan Note (Signed)
APTT is slightly elavted He has had some bleeding and bruising concerns Will get hematology evaluate further

## 2014-03-30 NOTE — Progress Notes (Signed)
Subjective:    Patient ID: Colton Andersen, male    DOB: 04-04-1960, 54 y.o.   MRN: [id number]  HPI  He complains of a severe left-sided nosebleed that woke him from his sleep 5 days ago. He applied pressure and after a few minute the bleeding ceased. He has had a few bruises across his chest over the last few weeks but those has resolved. He was taking asa the time but has since stopped taking it.   Review of Systems  Constitutional: Negative.  Negative for fever, chills, diaphoresis, appetite change and fatigue.  HENT: Positive for nosebleeds. Negative for congestion, postnasal drip, rhinorrhea, sinus pressure, sneezing, sore throat and trouble swallowing.   Eyes: Negative.   Respiratory: Negative.  Negative for cough, choking, chest tightness, shortness of breath and stridor.   Cardiovascular: Negative.  Negative for palpitations and leg swelling.  Gastrointestinal: Negative.  Negative for nausea, vomiting, abdominal pain, diarrhea, constipation, blood in stool and anal bleeding.  Endocrine: Negative.   Genitourinary: Negative.  Negative for dysuria, urgency, frequency, hematuria, flank pain, decreased urine volume and difficulty urinating.  Musculoskeletal: Positive for back pain and arthralgias. Negative for myalgias, joint swelling, gait problem, neck pain and neck stiffness.  Skin: Negative.  Negative for pallor and rash.  Allergic/Immunologic: Negative.   Neurological: Negative.   Hematological: Negative for adenopathy. Bruises/bleeds easily.  Psychiatric/Behavioral: Negative.        Objective:   Physical Exam  Constitutional: He appears well-developed and well-nourished.  Non-toxic appearance. He does not have a sickly appearance. He does not appear ill. No distress.  HENT:  Head: Normocephalic and atraumatic.  Nose: No mucosal edema, rhinorrhea, sinus tenderness, nasal deformity or nasal septal hematoma. No epistaxis.  No foreign bodies. Right sinus exhibits no maxillary  sinus tenderness and no frontal sinus tenderness. Left sinus exhibits no maxillary sinus tenderness and no frontal sinus tenderness.  Mouth/Throat: Oropharynx is clear and moist and mucous membranes are normal. Mucous membranes are not pale, not dry and not cyanotic. No oral lesions. No trismus in the jaw. No uvula swelling. No oropharyngeal exudate, posterior oropharyngeal edema, posterior oropharyngeal erythema or tonsillar abscesses.  Eyes: Conjunctivae are normal. Right eye exhibits no discharge. Left eye exhibits no discharge. No scleral icterus.  Neck: Normal range of motion. Neck supple. No JVD present. No tracheal deviation present. No thyromegaly present.  Cardiovascular: Normal rate, regular rhythm, normal heart sounds and intact distal pulses.  Exam reveals no gallop and no friction rub.   No murmur heard. Pulmonary/Chest: Effort normal and breath sounds normal. No stridor. No respiratory distress. He has no wheezes. He has no rales. He exhibits no tenderness.  Abdominal: Soft. Bowel sounds are normal. He exhibits no distension. There is no tenderness. There is no rebound and no guarding.  Musculoskeletal: He exhibits no edema or tenderness.  Lymphadenopathy:    He has no cervical adenopathy.  Skin: Skin is warm and dry. No bruising, no ecchymosis, no petechiae and no rash noted. He is not diaphoretic. No erythema. No pallor.  Psychiatric: He has a normal mood and affect. His behavior is normal. Judgment and thought content normal.  Vitals reviewed.    Lab Results  Component Value Date   WBC 7.5 03/29/2014   HGB 15.3 03/29/2014   HCT 45.4 03/29/2014   PLT 235.0 03/29/2014   GLUCOSE 111* 03/29/2014   CHOL 193 06/05/2013   TRIG 83.0 06/05/2013   HDL 49.50 06/05/2013   LDLDIRECT 153.2 11/08/2008   [place]  127* 06/05/2013   ALT 44 03/29/2014   AST 30 03/29/2014   NA 140 03/29/2014   K 4.9 03/29/2014   CL 105 03/29/2014   CREATININE 1.2 03/29/2014   BUN 17 03/29/2014   CO2  27 03/29/2014   TSH 1.47 06/20/2011   PSA 1.67 06/05/2013   INR 1.0 03/29/2014   HGBA1C 5.5 06/28/2008       Assessment & Plan:

## 2014-06-03 IMAGING — US US ABDOMEN COMPLETE
1 series · 14 of 25 positions shown · non-contrast
Comparison: None.

CLINICAL DATA: Hepatitis C, hepatoma surveillance

COMPLETE ABDOMINAL ULTRASOUND

[Series 1: us abdomen complete · 0.33mm/px · 14 of 77 slices shown]
[im 1/77]
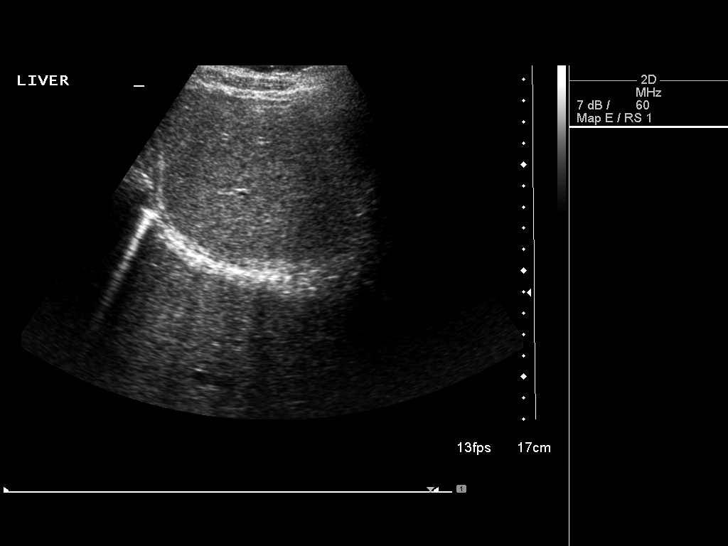
[im 7/77]
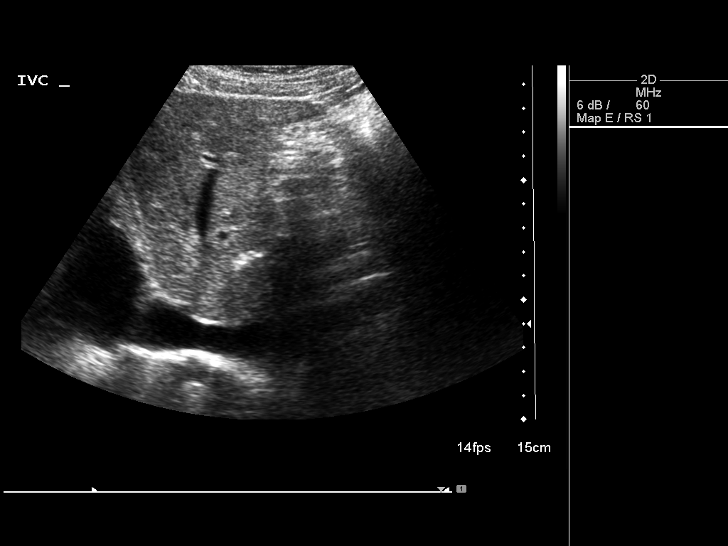
[im 13/77]
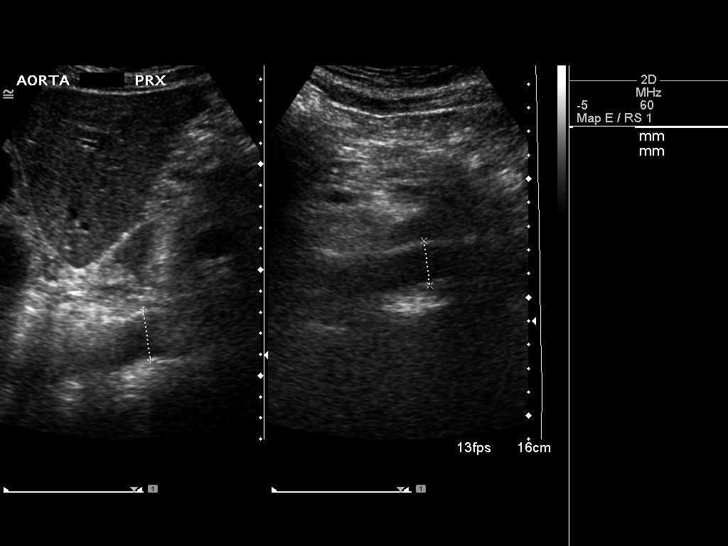
[im 20/77]
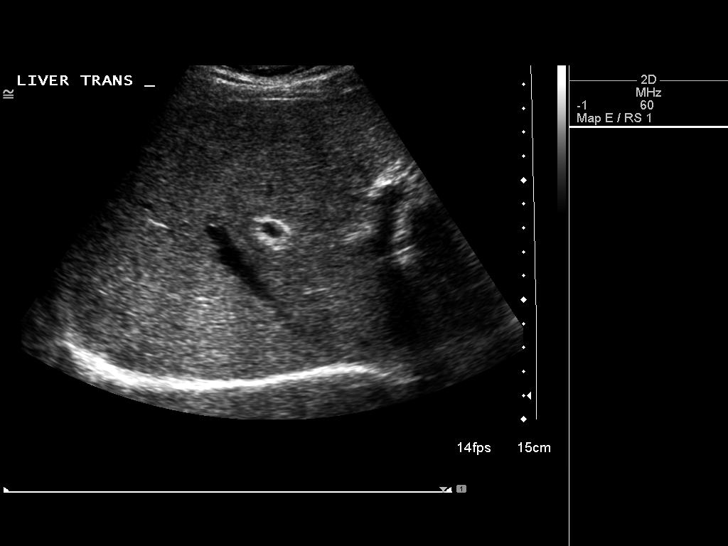
[im 26/77]
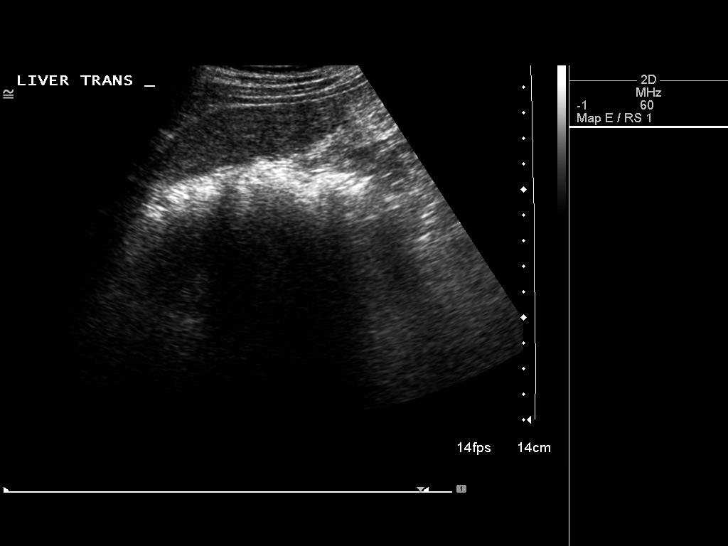
[im 29/77]
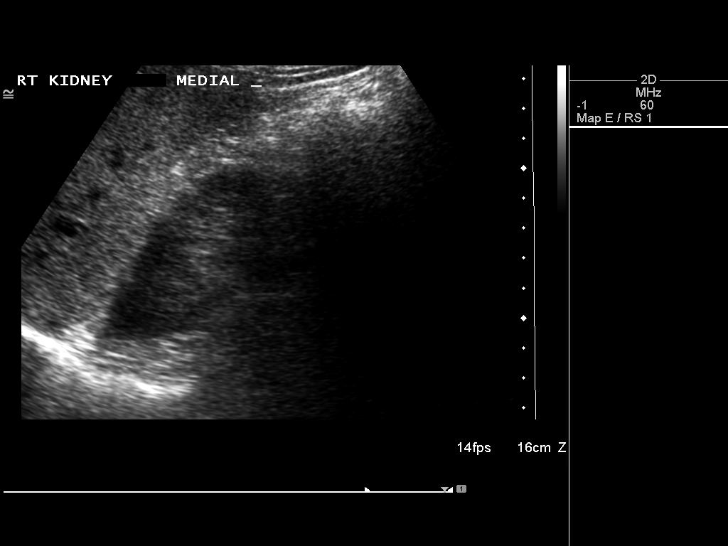
[im 35/77]
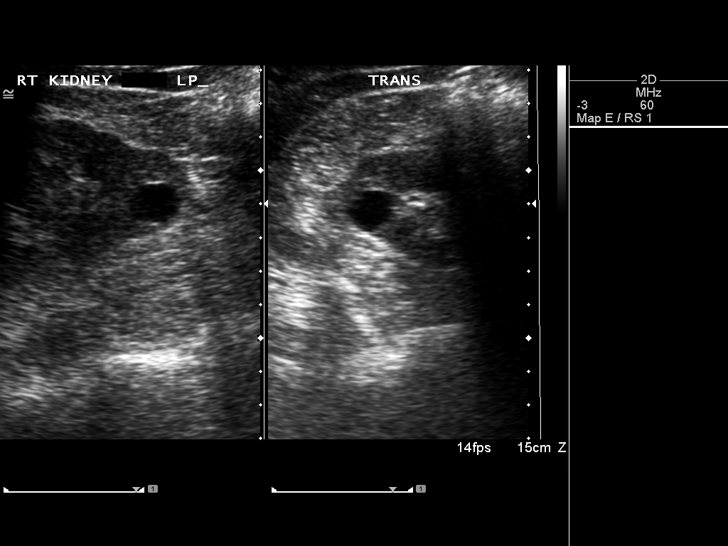
[im 42/77]
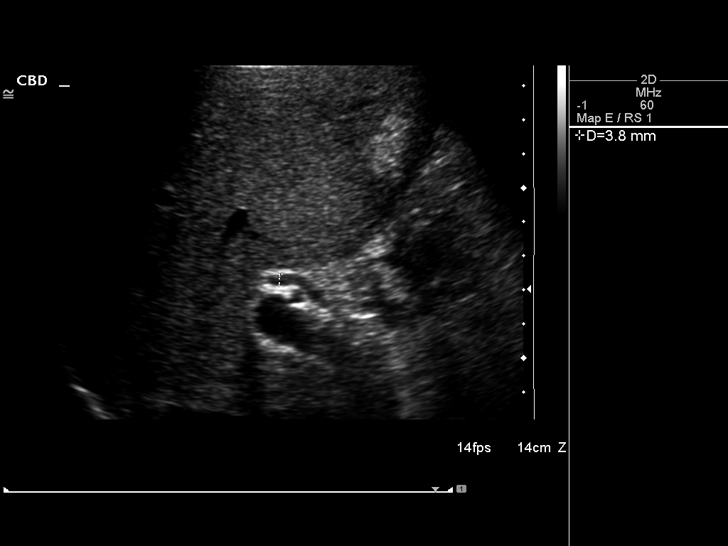
[im 48/77]
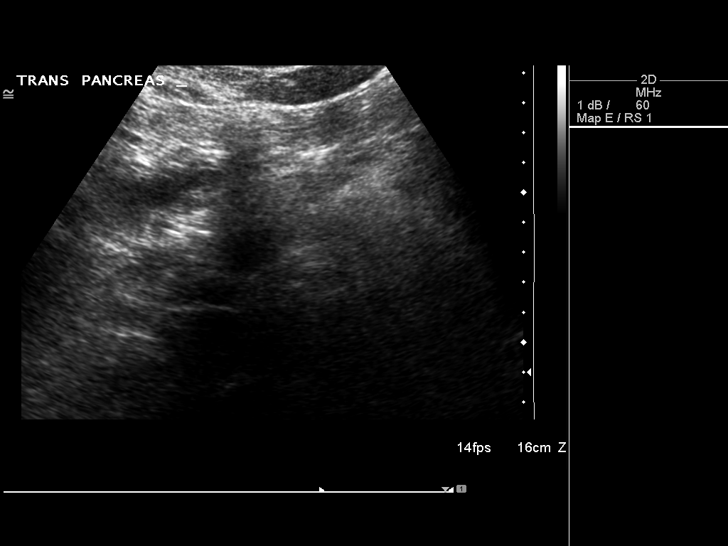
[im 51/77]
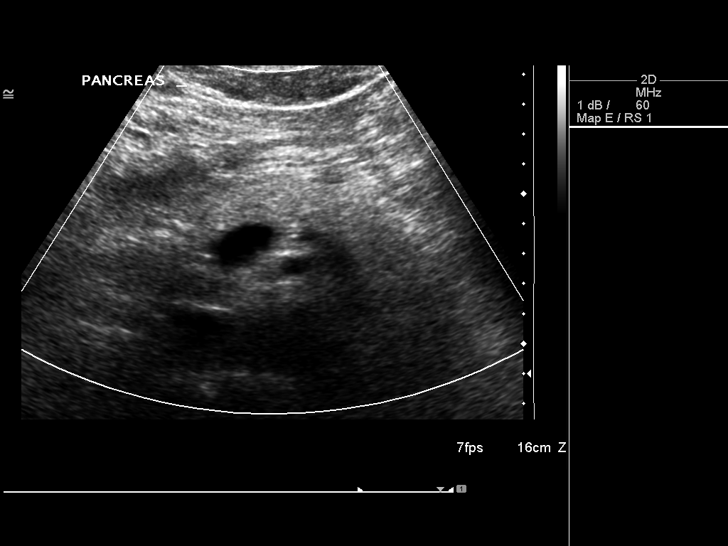
[im 58/77]
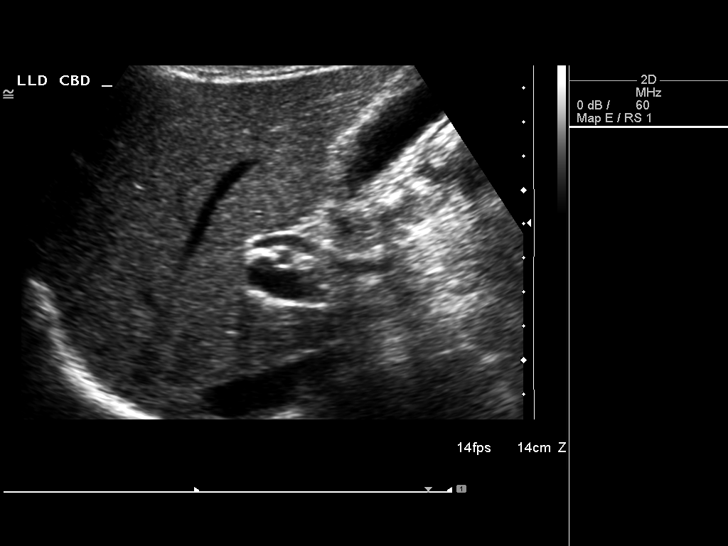
[im 64/77]
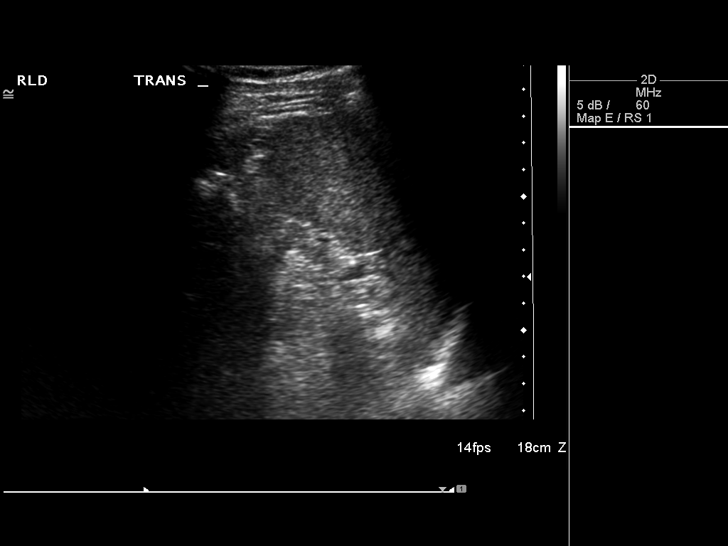
[im 70/77]
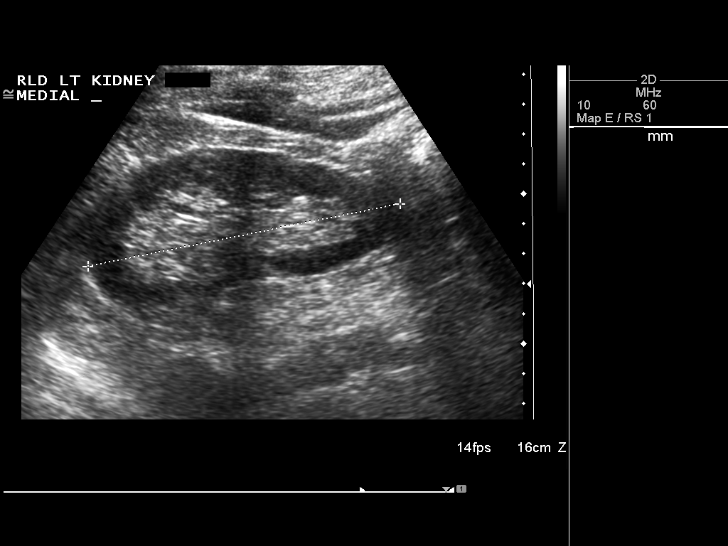
[im 77/77]
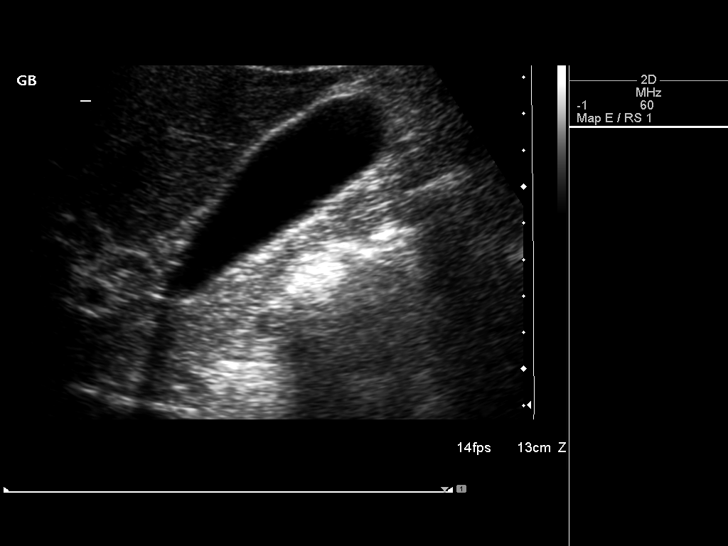

[14 of 25 positions shown; findings below may reference images not displayed]

FINDINGS: Gallbladder:  The gallbladder is visualized and no gallstones are
noted.  There is no pain over the gallbladder with compression.

Common bile duct:  The common bile duct is normal measuring 3.8 mm
in diameter.

Liver:  The liver has a normal echogenic pattern.  No focal
abnormality is seen.

IVC:  Appears normal.

Pancreas:  No focal abnormality seen.

Spleen:  The spleen is normal measuring 5.1 cm sagittally.

Right Kidney:  No hydronephrosis is seen.  The right kidney
measures 10.9 cm sagittally.  A 1.6 cm cyst is noted in the lower
pole.

Left Kidney:  No hydronephrosis is noted.  The left kidney measures
11.2 cm.

Abdominal aorta:  The abdominal aorta is normal in caliber.
IMPRESSION: Negative abdominal ultrasound.  No hepatic abnormality.  No
gallstones.

## 2014-09-07 ENCOUNTER — Ambulatory Visit (INDEPENDENT_AMBULATORY_CARE_PROVIDER_SITE_OTHER): Payer: BLUE CROSS/BLUE SHIELD | Admitting: Family Medicine

## 2014-09-07 VITALS — BP 126/78 | HR 90 | Temp 98.3°F | Resp 17 | Ht 68.0 in | Wt 153.0 lb

## 2014-09-07 DIAGNOSIS — R079 Chest pain, unspecified: Secondary | ICD-10-CM | POA: Diagnosis not present

## 2014-09-07 DIAGNOSIS — R0789 Other chest pain: Secondary | ICD-10-CM

## 2014-09-07 LAB — TROPONIN I: Troponin I: <0.01 ng/mL (ref <0.06)
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
## 2014-09-08 ENCOUNTER — Encounter: Payer: Self-pay | Admitting: Family Medicine

## 2014-09-09 ENCOUNTER — Encounter: Payer: Self-pay | Admitting: Internal Medicine

## 2014-09-09 ENCOUNTER — Ambulatory Visit (INDEPENDENT_AMBULATORY_CARE_PROVIDER_SITE_OTHER): Payer: BLUE CROSS/BLUE SHIELD | Admitting: Internal Medicine

## 2014-09-09 VITALS — BP 120/80 | HR 90 | Temp 98.5°F | Resp 16 | Ht 68.0 in | Wt 152.0 lb

## 2014-09-09 DIAGNOSIS — M159 Polyosteoarthritis, unspecified: Secondary | ICD-10-CM

## 2014-09-09 DIAGNOSIS — M150 Primary generalized (osteo)arthritis: Secondary | ICD-10-CM

## 2014-09-09 DIAGNOSIS — R0789 Other chest pain: Secondary | ICD-10-CM

## 2014-09-09 DIAGNOSIS — M5137 Other intervertebral disc degeneration, lumbosacral region: Secondary | ICD-10-CM

## 2014-09-09 DIAGNOSIS — M8949 Other hypertrophic osteoarthropathy, multiple sites: Secondary | ICD-10-CM

## 2014-09-09 MED ORDER — TRAMADOL HCL 50 MG PO TABS: ORAL_TABLET | ORAL | Status: DC
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
## 2014-09-09 NOTE — Progress Notes (Signed)
Pre visit review using our clinic review tool, if applicable. No additional management support is needed unless otherwise documented below in the visit note. 

## 2014-09-09 NOTE — Patient Instructions (Signed)

## 2015-01-21 ENCOUNTER — Other Ambulatory Visit: Payer: Self-pay | Admitting: Internal Medicine

## 2015-01-25 ENCOUNTER — Other Ambulatory Visit (HOSPITAL_COMMUNITY): Payer: Self-pay | Admitting: Nurse Practitioner

## 2015-01-25 DIAGNOSIS — B182 Chronic viral hepatitis C: Secondary | ICD-10-CM

## 2015-01-26 NOTE — Telephone Encounter (Signed)
Wife left msg on triage stating pharmacy stated that they did not get refill on Tramadol. Called [facility] spoke with Caryl Pina verified if they received fax from md on pt Tramadol. Caryl Pina stated yes & its ready for pick-up. Called pt inform him rx was received and waiting for him to pick-up...Colton Andersen

## 2015-02-10 ENCOUNTER — Ambulatory Visit (HOSPITAL_COMMUNITY)
Admission: RE | Admit: 2015-02-10 | Discharge: 2015-02-10 | Disposition: A | Discharge status: Home / Self Care | Payer: BLUE CROSS/BLUE SHIELD | Source: Ambulatory Visit | Attending: Nurse Practitioner | Admitting: Nurse Practitioner

## 2015-02-10 DIAGNOSIS — B182 Chronic viral hepatitis C: Secondary | ICD-10-CM | POA: Diagnosis not present

## 2015-04-11 ENCOUNTER — Encounter: Payer: Self-pay | Admitting: Internal Medicine

## 2015-04-11 ENCOUNTER — Ambulatory Visit (INDEPENDENT_AMBULATORY_CARE_PROVIDER_SITE_OTHER): Payer: BLUE CROSS/BLUE SHIELD | Admitting: Internal Medicine

## 2015-04-11 VITALS — BP 124/86 | HR 89 | Temp 98.7°F | Resp 20 | Ht 68.0 in | Wt 153.5 lb

## 2015-04-11 DIAGNOSIS — N453 Epididymo-orchitis: Secondary | ICD-10-CM | POA: Diagnosis not present

## 2015-04-11 DIAGNOSIS — N451 Epididymitis: Secondary | ICD-10-CM | POA: Insufficient documentation

## 2015-04-11 MED ORDER — LEVOFLOXACIN 500 MG PO TABS: 500.0000 mg | ORAL_TABLET | Freq: Every day | ORAL | Status: DC

## 2015-04-11 NOTE — Assessment & Plan Note (Signed)
Mild to mod, for antibx course,  to f/u any worsening symptoms or concerns 
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
## 2015-04-11 NOTE — Progress Notes (Signed)
Pre visit review using our clinic review tool, if applicable. No additional management support is needed unless otherwise documented below in the visit note. 

## 2015-04-11 NOTE — Patient Instructions (Signed)
Please take all new medication as prescribed - the antibiotic  OK for work note today  You can also take tylenol as needed for pain. (or advil or alleve)  Please continue all other medications as before, and refills have been done if requested.  Please have the pharmacy call with any other refills you may need.  Please keep your appointments with your specialists as you may have planned

## 2015-04-14 ENCOUNTER — Other Ambulatory Visit: Payer: Self-pay | Admitting: Internal Medicine

## 2015-06-20 ENCOUNTER — Other Ambulatory Visit: Payer: Self-pay | Admitting: Internal Medicine

## 2015-06-23 ENCOUNTER — Ambulatory Visit (INDEPENDENT_AMBULATORY_CARE_PROVIDER_SITE_OTHER): Payer: BLUE CROSS/BLUE SHIELD | Admitting: Internal Medicine

## 2015-06-23 ENCOUNTER — Encounter: Payer: Self-pay | Admitting: Internal Medicine

## 2015-06-23 ENCOUNTER — Ambulatory Visit (INDEPENDENT_AMBULATORY_CARE_PROVIDER_SITE_OTHER)
Admission: RE | Admit: 2015-06-23 | Discharge: 2015-06-23 | Disposition: A | Discharge status: Home / Self Care | Payer: BLUE CROSS/BLUE SHIELD | Source: Ambulatory Visit | Attending: Internal Medicine | Admitting: Internal Medicine

## 2015-06-23 VITALS — BP 116/70 | HR 85 | Temp 98.3°F | Resp 16 | Ht 68.0 in | Wt 152.0 lb

## 2015-06-23 DIAGNOSIS — M25571 Pain in right ankle and joints of right foot: Secondary | ICD-10-CM

## 2015-06-23 DIAGNOSIS — M2550 Pain in unspecified joint: Secondary | ICD-10-CM | POA: Insufficient documentation

## 2015-06-23 NOTE — Progress Notes (Signed)
Pre visit review using our clinic review tool, if applicable. No additional management support is needed unless otherwise documented below in the visit note. 

## 2015-06-23 NOTE — Progress Notes (Signed)
Subjective:  Patient ID: Colton Andersen, male    DOB: [birth year]  Age: 55 y.o. MRN: [id number]  CC: Foot Pain   HPI Colton Andersen presents for right foot pain for about 2-1/2 months. He thinks he may have sprained it but can't recall any specific trauma or injury. He has taken tramadol which has provided some symptom relief.  Outpatient Prescriptions Prior to Visit  Medication Sig Dispense Refill  . lamoTRIgine (LAMICTAL) 200 MG tablet Take 200 mg by mouth daily.    . QUEtiapine (SEROQUEL) 25 MG tablet Take 25 mg by mouth at bedtime.    . traMADol (ULTRAM) 50 MG tablet TAKE 1 TABLET BY MOUTH EVERY 8 HOURS AS NEEDED 50 tablet 3  . diazepam (VALIUM) 10 MG tablet TK 1 T PO  BID  2  . Ledipasvir-Sofosbuvir (HARVONI) 90-400 MG TABS Take 400 mg by mouth once.    Marland Kitchen levofloxacin (LEVAQUIN) 500 MG tablet Take 1 tablet (500 mg total) by mouth daily. 10 tablet 0   No facility-administered medications prior to visit.    ROS Review of Systems  Musculoskeletal: Positive for arthralgias. Negative for back pain.  Skin: Negative for color change and rash.  All other systems reviewed and are negative.   Objective:  BP 116/70 mmHg  Pulse 85  Temp(Src) 98.3 F (36.8 C) (Oral)  Resp 16  Ht 5\' 8"  (1.727 m)  Wt 152 lb (68.947 kg)  BMI 23.12 kg/m2  SpO2 99%  BP Readings from Last 3 Encounters:  06/23/15 116/70  04/11/15 124/86  09/09/14 120/80    Wt Readings from Last 3 Encounters:  06/23/15 152 lb (68.947 kg)  04/11/15 153 lb 8 oz (69.627 kg)  09/09/14 152 lb (68.947 kg)    Physical Exam  Musculoskeletal:       Right foot: There is tenderness and bony tenderness. There is normal range of motion, no swelling, normal capillary refill, no crepitus, no deformity and no laceration.       Feet:    Lab Results  Component Value Date   WBC 7.5 03/29/2014   HGB 15.3 03/29/2014   HCT 45.4 03/29/2014   PLT 235.0 03/29/2014   GLUCOSE 111* 03/29/2014   CHOL 193 06/05/2013   TRIG  83.0 06/05/2013   HDL 49.50 06/05/2013   LDLDIRECT 153.2 11/08/2008   LDLCALC 127* 06/05/2013   ALT 44 03/29/2014   AST 30 03/29/2014   NA 140 03/29/2014   K 4.9 03/29/2014   CL 105 03/29/2014   CREATININE 1.2 03/29/2014   BUN 17 03/29/2014   CO2 27 03/29/2014   TSH 1.47 06/20/2011   PSA 1.67 06/05/2013   INR 1.0 03/29/2014   HGBA1C 5.5 06/28/2008    US Abdomen Complete W/elastography  02/10/2015  CLINICAL DATA:  Hepatitis-C diagnosed 2 years ago. EXAM: ULTRASOUND ABDOMEN COMPLETE ULTRASOUND HEPATIC ELASTOGRAPHY TECHNIQUE: Sonography of the upper abdomen was performed. In addition, ultrasound elastography evaluation of the liver was performed. A region of interest was placed within the right lobe of the liver. Following application of a compressive sonographic pulse, shear waves were detected in the adjacent hepatic tissue and the shear wave velocity was calculated. Multiple assessments were performed at the selected site. Median shear wave velocity is correlated to a Metavir fibrosis score. COMPARISON:  None. FINDINGS: ULTRASOUND ABDOMEN Gallbladder: Gallbladder has a normal appearance. Gallbladder wall is 2.0 mm, within normal limits. No stones or pericholecystic fluid. No sonographic Murphy's sign. Common bile duct: Diameter: 4.0 mm Liver: No  focal lesion identified. Within normal limits in parenchymal echogenicity. IVC: No abnormality visualized. Pancreas: Visualized portion unremarkable. Spleen: Size and appearance within normal limits. Right Kidney: Length: 11.2 cm. Echogenicity is normal. No hydronephrosis. Lower pole cyst is 2.0 x 1.7 x 2.0 cm. Left Kidney: Length: 11.1 cm. Echogenicity within normal limits. No mass or hydronephrosis visualized. Abdominal aorta: No aneurysm visualized. Other findings: None. ULTRASOUND HEPATIC ELASTOGRAPHY Device: Siemens Helix VTQ Transducer 6 C1 Patient position: Supine Number of measurements:  10 Hepatic Segment:  8 Median velocity:   2.11  m/sec IQR:  0.43 IQR/Median velocity ratio 0.20 Corresponding Metavir fibrosis score:  F2 and some F3 Risk of fibrosis: Moderate Limitations of exam: None Pertinent findings noted on other imaging exams:  None Please note that abnormal shear wave velocities may also be identified in clinical settings other than with hepatic fibrosis, such as: acute hepatitis, elevated right heart and central venous pressures including use of beta blockers, veno-occlusive disease (Budd-Chiari), infiltrative processes such as mastocytosis/amyloidosis/infiltrative tumor, extrahepatic cholestasis, in the post-prandial state, and liver transplantation. Correlation with patient history, laboratory data, and clinical condition recommended. IMPRESSION: 1. No evidence for acute abnormality of the abdomen. 2. Median hepatic shear wave velocity is calculated at 0.20 m/sec. 3. Corresponding Metavir fibrosis score is F2 and some F3. 4. Risk of fibrosis is moderate. 5. Follow-up:  Additional testing is appropriate. Electronically Signed   By: Nolon Nations M.D.   On: 02/10/2015 11:20    Assessment & Plan:   Colton Andersen was seen today for foot pain.  Diagnoses and all orders for this visit:  Pain in joint, ankle and foot, right- Exam is remarkable for an area of tenderness to palpation but the plain film is normal, if the pain does not resolve or will consider ordering an MRI. -     DG Foot Complete Right; Future   I have discontinued Colton Andersen's diazepam, Ledipasvir-Sofosbuvir, and levofloxacin. I am also having him maintain his QUEtiapine, lamoTRIgine, and traMADol.  No orders of the defined types were placed in this encounter.     Follow-up: Return if symptoms worsen or fail to improve.  Colton Calico, MD

## 2015-06-23 NOTE — Patient Instructions (Signed)

## 2015-06-24 ENCOUNTER — Encounter: Payer: Self-pay | Admitting: Internal Medicine

## 2015-06-24 NOTE — Telephone Encounter (Signed)
Per [name] rx was given to pt while he was at office yesterday...Colton Andersen

## 2015-06-29 ENCOUNTER — Telehealth: Payer: Self-pay | Admitting: Internal Medicine

## 2015-06-29 DIAGNOSIS — M25571 Pain in right ankle and joints of right foot: Secondary | ICD-10-CM

## 2015-06-29 NOTE — Telephone Encounter (Signed)
Pt called in and wants to know the result of his xray on his foot and what is going to be done about it?

## 2015-06-29 NOTE — Telephone Encounter (Signed)
Pt informed. Still experincing foot pain. States he has an appt with ([facility]?) Tramadol only helps temporarily is there something else than can be sent in place or to help with discomfort?

## 2015-06-30 MED ORDER — MELOXICAM 15 MG PO TABS: 15.0000 mg | ORAL_TABLET | Freq: Every day | ORAL | Status: DC

## 2015-06-30 NOTE — Telephone Encounter (Signed)
Rx sent  MRI ordered

## 2015-06-30 NOTE — Telephone Encounter (Signed)
He agrees to both states he has already tried motrin and tylenol

## 2015-06-30 NOTE — Telephone Encounter (Signed)
I will order an MRI of his foot Can he take nsaids?

## 2015-07-07 ENCOUNTER — Encounter: Payer: Self-pay | Admitting: Internal Medicine

## 2015-07-07 ENCOUNTER — Ambulatory Visit
Admission: RE | Admit: 2015-07-07 | Discharge: 2015-07-07 | Disposition: A | Discharge status: Home / Self Care | Payer: BLUE CROSS/BLUE SHIELD | Source: Ambulatory Visit | Attending: Internal Medicine | Admitting: Internal Medicine

## 2015-07-07 DIAGNOSIS — M19071 Primary osteoarthritis, right ankle and foot: Secondary | ICD-10-CM | POA: Diagnosis not present

## 2015-07-07 DIAGNOSIS — M25571 Pain in right ankle and joints of right foot: Secondary | ICD-10-CM

## 2015-08-31 ENCOUNTER — Telehealth: Payer: Self-pay | Admitting: Internal Medicine

## 2015-08-31 DIAGNOSIS — M25579 Pain in unspecified ankle and joints of unspecified foot: Secondary | ICD-10-CM

## 2015-08-31 NOTE — Telephone Encounter (Signed)
Pt wife called in and would like a nurse to call her with pt mri result. He got the report but they do not understand what it means.  They also would like a referral to a podiatrist

## 2015-09-01 NOTE — Telephone Encounter (Signed)
This is referring to the MRI done in April this yr.

## 2015-09-01 NOTE — Telephone Encounter (Signed)
Pt informed

## 2015-09-01 NOTE — Telephone Encounter (Signed)
Referral sent 

## 2015-09-05 ENCOUNTER — Other Ambulatory Visit: Payer: Self-pay | Admitting: Internal Medicine

## 2015-09-06 NOTE — Telephone Encounter (Signed)
Faxed script to [facility].../lmb 

## 2015-09-14 DIAGNOSIS — R2241 Localized swelling, mass and lump, right lower limb: Secondary | ICD-10-CM | POA: Diagnosis not present

## 2015-09-14 DIAGNOSIS — M79671 Pain in right foot: Secondary | ICD-10-CM | POA: Diagnosis not present

## 2015-09-14 DIAGNOSIS — R262 Difficulty in walking, not elsewhere classified: Secondary | ICD-10-CM | POA: Diagnosis not present

## 2015-09-15 DIAGNOSIS — R2241 Localized swelling, mass and lump, right lower limb: Secondary | ICD-10-CM | POA: Diagnosis not present

## 2015-09-15 DIAGNOSIS — M79671 Pain in right foot: Secondary | ICD-10-CM | POA: Diagnosis not present

## 2015-09-15 DIAGNOSIS — R262 Difficulty in walking, not elsewhere classified: Secondary | ICD-10-CM | POA: Diagnosis not present

## 2015-09-19 DIAGNOSIS — D1739 Benign lipomatous neoplasm of skin and subcutaneous tissue of other sites: Secondary | ICD-10-CM | POA: Diagnosis not present

## 2015-09-19 DIAGNOSIS — D2121 Benign neoplasm of connective and other soft tissue of right lower limb, including hip: Secondary | ICD-10-CM | POA: Diagnosis not present

## 2015-09-19 DIAGNOSIS — R2241 Localized swelling, mass and lump, right lower limb: Secondary | ICD-10-CM | POA: Diagnosis not present

## 2015-09-19 DIAGNOSIS — R234 Changes in skin texture: Secondary | ICD-10-CM | POA: Diagnosis not present

## 2015-10-17 ENCOUNTER — Other Ambulatory Visit: Payer: Self-pay | Admitting: Internal Medicine

## 2015-10-17 DIAGNOSIS — M25571 Pain in right ankle and joints of right foot: Secondary | ICD-10-CM

## 2015-10-20 DIAGNOSIS — G5761 Lesion of plantar nerve, right lower limb: Secondary | ICD-10-CM | POA: Diagnosis not present

## 2015-10-20 DIAGNOSIS — R2689 Other abnormalities of gait and mobility: Secondary | ICD-10-CM | POA: Diagnosis not present

## 2015-10-26 DIAGNOSIS — G5761 Lesion of plantar nerve, right lower limb: Secondary | ICD-10-CM | POA: Diagnosis not present

## 2015-10-26 DIAGNOSIS — R2689 Other abnormalities of gait and mobility: Secondary | ICD-10-CM | POA: Diagnosis not present

## 2015-11-02 DIAGNOSIS — B182 Chronic viral hepatitis C: Secondary | ICD-10-CM | POA: Diagnosis not present

## 2015-11-15 DIAGNOSIS — F3163 Bipolar disorder, current episode mixed, severe, without psychotic features: Secondary | ICD-10-CM | POA: Diagnosis not present

## 2015-11-21 DIAGNOSIS — K740 Hepatic fibrosis: Secondary | ICD-10-CM | POA: Diagnosis not present

## 2015-11-21 DIAGNOSIS — B182 Chronic viral hepatitis C: Secondary | ICD-10-CM | POA: Diagnosis not present

## 2016-01-12 ENCOUNTER — Other Ambulatory Visit: Payer: Self-pay | Admitting: Internal Medicine

## 2016-01-12 NOTE — Telephone Encounter (Signed)
Faxed script back to [facility].../lmb 

## 2016-02-01 ENCOUNTER — Ambulatory Visit (INDEPENDENT_AMBULATORY_CARE_PROVIDER_SITE_OTHER): Payer: BLUE CROSS/BLUE SHIELD | Admitting: Internal Medicine

## 2016-02-01 ENCOUNTER — Other Ambulatory Visit (INDEPENDENT_AMBULATORY_CARE_PROVIDER_SITE_OTHER): Payer: BLUE CROSS/BLUE SHIELD

## 2016-02-01 ENCOUNTER — Encounter: Payer: Self-pay | Admitting: Internal Medicine

## 2016-02-01 VITALS — BP 124/84 | HR 86 | Temp 98.1°F | Resp 16 | Ht 68.0 in | Wt 157.5 lb

## 2016-02-01 DIAGNOSIS — Z0000 Encounter for general adult medical examination without abnormal findings: Secondary | ICD-10-CM

## 2016-02-01 DIAGNOSIS — E785 Hyperlipidemia, unspecified: Secondary | ICD-10-CM | POA: Diagnosis not present

## 2016-02-01 DIAGNOSIS — N410 Acute prostatitis: Secondary | ICD-10-CM

## 2016-02-01 LAB — URINALYSIS, ROUTINE W REFLEX MICROSCOPIC
Bilirubin Urine: NEGATIVE
KETONES UR: NEGATIVE
Nitrite: NEGATIVE
PH: 6.5 (ref 5.0–8.0)
Specific Gravity, Urine: 1.01 (ref 1.000–1.030)
TOTAL PROTEIN, URINE-UPE24: NEGATIVE
URINE GLUCOSE: NEGATIVE
UROBILINOGEN UA: 0.2 (ref 0.0–1.0)

## 2016-02-01 LAB — CBC WITH DIFFERENTIAL/PLATELET
Basophils Absolute: 0 10*3/uL (ref 0.0–0.1)
Basophils Relative: 0.3 % (ref 0.0–3.0)
Eosinophils Absolute: 0.3 10*3/uL (ref 0.0–0.7)
Eosinophils Relative: 2.6 % (ref 0.0–5.0)
HCT: 45.4 % (ref 39.0–52.0)
Hemoglobin: 15.6 g/dL (ref 13.0–17.0)
Lymphocytes Relative: 31.6 % (ref 12.0–46.0)
Lymphs Abs: 3.2 10*3/uL (ref 0.7–4.0)
MCHC: 34.3 g/dL (ref 30.0–36.0)
MCV: 88.5 fl (ref 78.0–100.0)
Monocytes Absolute: 0.7 10*3/uL (ref 0.1–1.0)
Monocytes Relative: 6.7 % (ref 3.0–12.0)
Neutro Abs: 6 10*3/uL (ref 1.4–7.7)
Neutrophils Relative %: 58.8 % (ref 43.0–77.0)
Platelets: 286 10*3/uL (ref 150.0–400.0)
RBC: 5.13 Mil/uL (ref 4.22–5.81)
RDW: 13.4 % (ref 11.5–15.5)
WBC: 10.3 10*3/uL (ref 4.0–10.5)

## 2016-02-01 LAB — COMPREHENSIVE METABOLIC PANEL
ALT: 15 U/L (ref 0–53)
AST: 14 U/L (ref 0–37)
Albumin: 5 g/dL (ref 3.5–5.2)
Alkaline Phosphatase: 22 U/L — ABNORMAL LOW (ref 39–117)
BUN: 9 mg/dL (ref 6–23)
CO2: 31 mEq/L (ref 19–32)
Calcium: 10.6 mg/dL — ABNORMAL HIGH (ref 8.4–10.5)
Chloride: 102 mEq/L (ref 96–112)
Creatinine, Ser: 1.17 mg/dL (ref 0.40–1.50)
GFR: 68.61 mL/min (ref >60.00)
Glucose, Bld: 98 mg/dL (ref 70–99)
Potassium: 5.3 mEq/L — ABNORMAL HIGH (ref 3.5–5.1)
Sodium: 141 mEq/L (ref 135–145)
Total Bilirubin: 0.7 mg/dL (ref 0.2–1.2)
Total Protein: 8.1 g/dL (ref 6.0–8.3)

## 2016-02-01 LAB — LIPID PANEL
CHOLESTEROL: 218 mg/dL — AB (ref 0–200)
HDL: 52.2 mg/dL (ref >39.00)
LDL CALC: 148 mg/dL — AB (ref 0–99)
NonHDL: 166.19
Total CHOL/HDL Ratio: 4
Triglycerides: 93 mg/dL (ref 0.0–149.0)
VLDL: 18.6 mg/dL (ref 0.0–40.0)

## 2016-02-01 LAB — PSA: PSA: 2.96 ng/mL (ref 0.10–4.00)

## 2016-02-01 LAB — TSH: TSH: 0.78 u[IU]/mL (ref 0.35–4.50)

## 2016-02-01 NOTE — Progress Notes (Signed)
Pre visit review using our clinic review tool, if applicable. No additional management support is needed unless otherwise documented below in the visit note. 

## 2016-02-01 NOTE — Progress Notes (Signed)
Subjective:  Patient ID: Colton Andersen, male    DOB: [birth year]  Age: 55 y.o. MRN: [id number]  CC: Annual Exam   HPI Colton Andersen presents for a CPX.  He feels well and offers no complaints.  Outpatient Medications Prior to Visit  Medication Sig Dispense Refill  . lamoTRIgine (LAMICTAL) 200 MG tablet Take 200 mg by mouth daily.    . meloxicam (MOBIC) 15 MG tablet TAKE 1 TABLET(15 MG) BY MOUTH DAILY 30 tablet 5  . QUEtiapine (SEROQUEL) 25 MG tablet Take 25 mg by mouth at bedtime.    . traMADol (ULTRAM) 50 MG tablet TAKE 1 TABLET BY MOUTH EVERY 8 HOURS AS NEEDED FOR PAIN 50 tablet 2   No facility-administered medications prior to visit.     ROS Review of Systems  Constitutional: Negative.  Negative for diaphoresis and fatigue.  HENT: Negative.   Eyes: Negative.   Respiratory: Negative.  Negative for cough, chest tightness, shortness of breath and stridor.   Cardiovascular: Negative for chest pain, palpitations and leg swelling.  Gastrointestinal: Negative.  Negative for abdominal pain, constipation, diarrhea, nausea and vomiting.  Endocrine: Negative.   Genitourinary: Negative.  Negative for decreased urine volume, difficulty urinating, dysuria, frequency, hematuria, penile pain, scrotal swelling, testicular pain and urgency.  Musculoskeletal: Negative.  Negative for arthralgias, back pain, joint swelling, myalgias and neck pain.  Skin: Negative.  Negative for color change and rash.  Allergic/Immunologic: Negative.   Neurological: Negative.   Hematological: Negative.  Negative for adenopathy. Does not bruise/bleed easily.  Psychiatric/Behavioral: Negative.     Objective:  BP 124/84 (BP Location: Left Arm, Patient Position: Sitting, Cuff Size: Normal)   Pulse 86   Temp 98.1 F (36.7 C) (Oral)   Resp 16   Ht 5\' 8"  (1.727 m)   Wt 157 lb 8 oz (71.4 kg)   SpO2 98%   BMI 23.95 kg/m   BP Readings from Last 3 Encounters:  02/01/16 124/84  06/23/15 116/70  04/11/15  124/86    Wt Readings from Last 3 Encounters:  02/01/16 157 lb 8 oz (71.4 kg)  07/07/15 156 lb (70.8 kg)  06/23/15 152 lb (68.9 kg)    Physical Exam  Constitutional: He is oriented to person, place, and time. No distress.  HENT:  Mouth/Throat: Oropharynx is clear and moist. No oropharyngeal exudate.  Eyes: Conjunctivae are normal. Right eye exhibits no discharge. Left eye exhibits no discharge. No scleral icterus.  Neck: Normal range of motion. Neck supple. No JVD present. No tracheal deviation present. No thyromegaly present.  Cardiovascular: Normal rate, regular rhythm, normal heart sounds and intact distal pulses.  Exam reveals no gallop and no friction rub.   No murmur heard. Pulmonary/Chest: Effort normal and breath sounds normal. No respiratory distress. He has no wheezes. He has no rales. He exhibits no tenderness.  Abdominal: Soft. Bowel sounds are normal. He exhibits no distension and no mass. There is no tenderness. There is no rebound and no guarding. Hernia confirmed negative in the right inguinal area and confirmed negative in the left inguinal area.  Genitourinary: Rectum normal, testes normal and penis normal. Rectal exam shows no external hemorrhoid, no internal hemorrhoid, no fissure, no mass, no tenderness, anal tone normal and guaiac negative stool. Prostate is enlarged (1+ smooth symm BPH). Prostate is not tender. Right testis shows no mass, no swelling and no tenderness. Right testis is descended. Left testis shows no mass, no swelling and no tenderness. Left testis is descended. Circumcised. No  penile erythema or penile tenderness. No discharge found.  Musculoskeletal: Normal range of motion. He exhibits no edema, tenderness or deformity.  Lymphadenopathy:    He has no cervical adenopathy.       Right: No inguinal adenopathy present.       Left: No inguinal adenopathy present.  Neurological: He is oriented to person, place, and time.  Skin: Skin is warm and dry. No  rash noted. He is not diaphoretic. No erythema. No pallor.  Psychiatric: He has a normal mood and affect. His behavior is normal. Judgment and thought content normal.  Vitals reviewed.   Lab Results  Component Value Date   WBC 10.3 02/01/2016   HGB 15.6 02/01/2016   HCT 45.4 02/01/2016   PLT 286.0 02/01/2016   GLUCOSE 98 02/01/2016   CHOL 218 (H) 02/01/2016   TRIG 93.0 02/01/2016   HDL 52.20 02/01/2016   LDLDIRECT 153.2 11/08/2008   LDLCALC 148 (H) 02/01/2016   ALT 15 02/01/2016   AST 14 02/01/2016   NA 141 02/01/2016   K 5.3 (H) 02/01/2016   CL 102 02/01/2016   CREATININE 1.17 02/01/2016   BUN 9 02/01/2016   CO2 31 02/01/2016   TSH 0.78 02/01/2016   PSA 2.96 02/01/2016   INR 1.0 03/29/2014   HGBA1C 5.5 06/28/2008    Mr Foot Right Wo Contrast  Result Date: 07/07/2015 CLINICAL DATA:  Pain in the ball of the foot for 3 months. No known injury or prior relevant surgery. History of neuromuscular disorder. EXAM: MRI OF THE RIGHT FOREFOOT WITHOUT CONTRAST TECHNIQUE: Multiplanar, multisequence MR imaging was performed. No intravenous contrast was administered. COMPARISON:  Radiographs 06/23/2015 FINDINGS: A capsule was placed over the area of pain, along the plantar aspect of the second web space. There is mild heterogeneity and lobularity of the subcutaneous fat both medial and lateral to the marker. No focal fluid collection, mass or inflammatory changes seen. There is minimal fluid within the flexor tendon of the second digit, within physiologic limits. There are minimal degenerative changes of the first metatarsal phalangeal joint associated with a small effusion. No erosive changes are seen. The additional metatarsal phalangeal and interphalangeal joints appear normal. The alignment is normal at the Lisfranc joint. There is no evidence of acute fracture or dislocation. IMPRESSION: 1. No acute findings or clear explanation for the patient's symptoms. No evidence of Morton's neuroma on  noncontrast imaging. 2. Mild nonspecific lobularity of the subcutaneous fat along the ball of the foot. 3. Minimal first MTP degenerative changes. Electronically Signed   By: Richardean Sale M.D.   On: 07/07/2015 09:11    Assessment & Plan:   Jory was seen today for annual exam.  Diagnoses and all orders for this visit:  Routine general medical examination at a health care facility- Exam completed, labs ordered and reviewed, vaccines reviewed and updated, his colonoscopy is up-to-date, patient education material was given. -     Lipid panel; Future -     PSA; Future -     TSH; Future -     Urinalysis, Routine w reflex microscopic (not at [facility]); Future -     Comprehensive metabolic panel; Future -     CBC with Differential/Platelet; Future  Hyperlipidemia LDL goal <130- his Framingham risk score is about 10%, I've asked him to consider starting a statin for CV risk reduction and I am waiting for his response.  Prostatitis, acute- based on his urinalysis which shows red blood cells and white blood cells and  his prostate exam I think he has subacute bacterial prostatitis and will treat with Bactrim DS twice a day for 30 days. -     sulfamethoxazole-trimethoprim (BACTRIM DS,SEPTRA DS) 800-160 MG tablet; Take 1 tablet by mouth 2 (two) times daily.   I am having Mr. Levis start on sulfamethoxazole-trimethoprim. I am also having him maintain his QUEtiapine, lamoTRIgine, meloxicam, traMADol, and diazepam.  Meds ordered this encounter  Medications  . diazepam (VALIUM) 10 MG tablet    Sig: Take 10 mg by mouth.  . sulfamethoxazole-trimethoprim (BACTRIM DS,SEPTRA DS) 800-160 MG tablet    Sig: Take 1 tablet by mouth 2 (two) times daily.    Dispense:  60 tablet    Refill:  1     Follow-up: Return if symptoms worsen or fail to improve.  Scarlette Calico, MD

## 2016-02-01 NOTE — Patient Instructions (Signed)

## 2016-02-02 ENCOUNTER — Encounter: Payer: Self-pay | Admitting: Internal Medicine

## 2016-02-02 DIAGNOSIS — N410 Acute prostatitis: Secondary | ICD-10-CM | POA: Insufficient documentation

## 2016-02-02 MED ORDER — SULFAMETHOXAZOLE-TRIMETHOPRIM 800-160 MG PO TABS: 1.0000 | ORAL_TABLET | Freq: Two times a day (BID) | ORAL | 1 refills | Status: DC

## 2016-02-11 ENCOUNTER — Encounter: Payer: Self-pay | Admitting: Internal Medicine

## 2016-02-13 NOTE — Telephone Encounter (Signed)
Printed labs and placed to be mailed.

## 2016-02-28 ENCOUNTER — Other Ambulatory Visit: Payer: Self-pay | Admitting: Internal Medicine

## 2016-02-28 ENCOUNTER — Telehealth: Payer: Self-pay | Admitting: Internal Medicine

## 2016-02-28 DIAGNOSIS — N410 Acute prostatitis: Secondary | ICD-10-CM

## 2016-02-28 MED ORDER — LEVOFLOXACIN 500 MG PO TABS: 500.0000 mg | ORAL_TABLET | Freq: Every day | ORAL | 0 refills | Status: DC

## 2016-02-28 NOTE — Telephone Encounter (Signed)
Abx was rx'ed 4 weeks ago - Bactrim.   Tried to call pt but vm has not been set up.

## 2016-02-28 NOTE — Telephone Encounter (Signed)
Pt called in and said that the meds that he was given 2 weeks ago made his tongue swell.  He stopped taking it a week ago.  He wants that noted on his chart and a new med called in.  Can you call him when you get a chance ?    Best number -[phone]- work

## 2016-02-28 NOTE — Telephone Encounter (Signed)
LMOM with details regarding new rx sent to pharmacy.  Contacted pharmacy with same information and indicated Bactrim is an allergy.

## 2016-02-28 NOTE — Telephone Encounter (Signed)
New antibiotic rx written Allergy listed in his chart

## 2016-03-26 HISTORY — PX: FOOT SURGERY: SHX648

## 2016-04-05 ENCOUNTER — Other Ambulatory Visit: Payer: Self-pay | Admitting: Internal Medicine

## 2016-04-05 DIAGNOSIS — M25571 Pain in right ankle and joints of right foot: Secondary | ICD-10-CM

## 2016-04-05 DIAGNOSIS — R112 Nausea with vomiting, unspecified: Secondary | ICD-10-CM | POA: Diagnosis not present

## 2016-04-07 ENCOUNTER — Other Ambulatory Visit: Payer: Self-pay | Admitting: Internal Medicine

## 2016-04-09 NOTE — Telephone Encounter (Signed)
rx fxed to [facility].

## 2016-04-16 NOTE — Telephone Encounter (Signed)
Rec'd call from pharmacy stating MD was supposed to faxing rx for Tramadol, but we never received. Per chart rx was faxed back on 1/15. Gave MD authorization from 1/15...[name]

## 2016-04-18 DIAGNOSIS — M62830 Muscle spasm of back: Secondary | ICD-10-CM | POA: Diagnosis not present

## 2016-04-18 DIAGNOSIS — M5136 Other intervertebral disc degeneration, lumbar region: Secondary | ICD-10-CM | POA: Diagnosis not present

## 2016-04-18 DIAGNOSIS — M9903 Segmental and somatic dysfunction of lumbar region: Secondary | ICD-10-CM | POA: Diagnosis not present

## 2016-04-18 DIAGNOSIS — M5441 Lumbago with sciatica, right side: Secondary | ICD-10-CM | POA: Diagnosis not present

## 2016-04-24 ENCOUNTER — Encounter: Payer: Self-pay | Admitting: Internal Medicine

## 2016-04-24 DIAGNOSIS — K121 Other forms of stomatitis: Secondary | ICD-10-CM | POA: Diagnosis not present

## 2016-04-25 ENCOUNTER — Ambulatory Visit: Payer: BLUE CROSS/BLUE SHIELD | Admitting: Nurse Practitioner

## 2016-04-28 DIAGNOSIS — K740 Hepatic fibrosis: Secondary | ICD-10-CM | POA: Diagnosis not present

## 2016-05-02 ENCOUNTER — Other Ambulatory Visit (INDEPENDENT_AMBULATORY_CARE_PROVIDER_SITE_OTHER): Payer: BLUE CROSS/BLUE SHIELD

## 2016-05-02 ENCOUNTER — Ambulatory Visit (INDEPENDENT_AMBULATORY_CARE_PROVIDER_SITE_OTHER): Payer: BLUE CROSS/BLUE SHIELD | Admitting: Internal Medicine

## 2016-05-02 ENCOUNTER — Encounter: Payer: Self-pay | Admitting: Internal Medicine

## 2016-05-02 VITALS — BP 130/86 | HR 84 | Temp 98.2°F | Resp 16 | Ht 68.0 in | Wt 159.0 lb

## 2016-05-02 DIAGNOSIS — K148 Other diseases of tongue: Secondary | ICD-10-CM | POA: Diagnosis not present

## 2016-05-02 DIAGNOSIS — M79601 Pain in right arm: Secondary | ICD-10-CM

## 2016-05-02 DIAGNOSIS — M2550 Pain in unspecified joint: Secondary | ICD-10-CM | POA: Diagnosis not present

## 2016-05-02 DIAGNOSIS — M79602 Pain in left arm: Secondary | ICD-10-CM | POA: Diagnosis not present

## 2016-05-02 DIAGNOSIS — N410 Acute prostatitis: Secondary | ICD-10-CM

## 2016-05-02 DIAGNOSIS — R3129 Other microscopic hematuria: Secondary | ICD-10-CM

## 2016-05-02 DIAGNOSIS — R202 Paresthesia of skin: Secondary | ICD-10-CM

## 2016-05-02 DIAGNOSIS — G63 Polyneuropathy in diseases classified elsewhere: Secondary | ICD-10-CM

## 2016-05-02 DIAGNOSIS — E538 Deficiency of other specified B group vitamins: Secondary | ICD-10-CM

## 2016-05-02 LAB — COMPREHENSIVE METABOLIC PANEL
ALT: 15 U/L (ref 0–53)
AST: 15 U/L (ref 0–37)
Albumin: 4.8 g/dL (ref 3.5–5.2)
Alkaline Phosphatase: 19 U/L — ABNORMAL LOW (ref 39–117)
BUN: 12 mg/dL (ref 6–23)
CALCIUM: 10 mg/dL (ref 8.4–10.5)
CHLORIDE: 102 meq/L (ref 96–112)
CO2: 30 meq/L (ref 19–32)
Creatinine, Ser: 1.13 mg/dL (ref 0.40–1.50)
GFR: 71.36 mL/min (ref >60.00)
Glucose, Bld: 102 mg/dL — ABNORMAL HIGH (ref 70–99)
POTASSIUM: 4.1 meq/L (ref 3.5–5.1)
Sodium: 140 mEq/L (ref 135–145)
Total Bilirubin: 0.4 mg/dL (ref 0.2–1.2)
Total Protein: 8.1 g/dL (ref 6.0–8.3)

## 2016-05-02 LAB — URINALYSIS, ROUTINE W REFLEX MICROSCOPIC
BILIRUBIN URINE: NEGATIVE
Hgb urine dipstick: NEGATIVE
KETONES UR: NEGATIVE
LEUKOCYTES UA: NEGATIVE
Nitrite: NEGATIVE
PH: 7 (ref 5.0–8.0)
RBC / HPF: NONE SEEN (ref 0–?)
Specific Gravity, Urine: 1.01 (ref 1.000–1.030)
TOTAL PROTEIN, URINE-UPE24: NEGATIVE
UROBILINOGEN UA: 0.2 (ref 0.0–1.0)
Urine Glucose: NEGATIVE

## 2016-05-02 LAB — CBC WITH DIFFERENTIAL/PLATELET
Basophils Absolute: 0.1 10*3/uL (ref 0.0–0.1)
Basophils Relative: 0.7 % (ref 0.0–3.0)
EOS PCT: 4 % (ref 0.0–5.0)
Eosinophils Absolute: 0.3 10*3/uL (ref 0.0–0.7)
HCT: 43.1 % (ref 39.0–52.0)
HEMOGLOBIN: 14.7 g/dL (ref 13.0–17.0)
Lymphocytes Relative: 37.1 % (ref 12.0–46.0)
Lymphs Abs: 3 10*3/uL (ref 0.7–4.0)
MCHC: 34.1 g/dL (ref 30.0–36.0)
MCV: 88.9 fl (ref 78.0–100.0)
MONO ABS: 0.6 10*3/uL (ref 0.1–1.0)
Monocytes Relative: 7.4 % (ref 3.0–12.0)
Neutro Abs: 4 10*3/uL (ref 1.4–7.7)
Neutrophils Relative %: 50.8 % (ref 43.0–77.0)
Platelets: 263 10*3/uL (ref 150.0–400.0)
RBC: 4.85 Mil/uL (ref 4.22–5.81)
RDW: 13.8 % (ref 11.5–15.5)
WBC: 8 10*3/uL (ref 4.0–10.5)

## 2016-05-02 LAB — CK: Total CK: 45 U/L (ref 7–232)

## 2016-05-02 LAB — VITAMIN B12: VITAMIN B 12: 257 pg/mL (ref 211–911)

## 2016-05-02 LAB — C-REACTIVE PROTEIN: CRP: 0.2 mg/dL — AB (ref 0.5–20.0)

## 2016-05-02 LAB — SEDIMENTATION RATE: Sed Rate: 10 mm/hr (ref 0–20)

## 2016-05-02 NOTE — Patient Instructions (Signed)

## 2016-05-02 NOTE — Progress Notes (Signed)
Pre visit review using our clinic review tool, if applicable. No additional management support is needed unless otherwise documented below in the visit note. 

## 2016-05-02 NOTE — Progress Notes (Signed)
Subjective:  Patient ID: Colton Andersen, male    DOB: 1960/10/17  Age: 56 y.o. MRN: [id number]  CC: Osteoarthritis   HPI Colton Andersen presents for a multitude of complaints. For the last month he has had an ulcer on the top of his right tongue. He has been seen in several places and has been prescribed systemic prednisone, Valtrex, and lidocaine but he tells me the ulcer will not heal and it is painful. He also tells me his tongue feels red and sore. In addition he complains of pain, numbness, tingling weakness symmetrically and diffusely in both upper extremities. He said sometimes his arms feel too weak and heavy for him to lift them. He has had no neck pain. He also complains of arthralgias in multiple joints, large and small joints. His joints never feel red or swollen. He suffers from chronic low back pain.  Outpatient Medications Prior to Visit  Medication Sig Dispense Refill  . diazepam (VALIUM) 10 MG tablet Take 10 mg by mouth.    . lamoTRIgine (LAMICTAL) 200 MG tablet Take 200 mg by mouth daily.    . meloxicam (MOBIC) 15 MG tablet TAKE 1 TABLET(15 MG) BY MOUTH DAILY 30 tablet 5  . traMADol (ULTRAM) 50 MG tablet TAKE 1 TABLET BY MOUTH EVERY 8 HOURS AS NEEDED FOR PAIN 50 tablet 5  . levofloxacin (LEVAQUIN) 500 MG tablet Take 1 tablet (500 mg total) by mouth daily. 30 tablet 0  . QUEtiapine (SEROQUEL) 25 MG tablet Take 25 mg by mouth at bedtime.     No facility-administered medications prior to visit.     ROS Review of Systems  Constitutional: Negative for activity change, appetite change, chills, diaphoresis, fatigue, fever and unexpected weight change.  HENT: Negative.  Negative for facial swelling, sore throat, trouble swallowing and voice change.   Eyes: Negative for visual disturbance.  Respiratory: Negative for cough, choking, chest tightness, shortness of breath and stridor.   Cardiovascular: Negative for chest pain, palpitations and leg swelling.  Gastrointestinal:  Negative for abdominal pain, blood in stool, constipation, diarrhea, nausea and vomiting.  Endocrine: Negative.  Negative for cold intolerance and heat intolerance.  Genitourinary: Negative.  Negative for difficulty urinating, discharge, dysuria, genital sores, hematuria, penile swelling, scrotal swelling, testicular pain and urgency.  Musculoskeletal: Positive for arthralgias and back pain. Negative for gait problem, joint swelling, myalgias, neck pain and neck stiffness.  Skin: Negative.  Negative for color change, pallor and rash.  Allergic/Immunologic: Negative.  Negative for immunocompromised state.  Neurological: Positive for weakness and numbness. Negative for dizziness, tremors, facial asymmetry, speech difficulty, light-headedness and headaches.  Hematological: Negative for adenopathy. Does not bruise/bleed easily.    Objective:  BP 130/86 (BP Location: Left Arm, Patient Position: Sitting, Cuff Size: Normal)   Pulse 84   Temp 98.2 F (36.8 C) (Oral)   Resp 16   Ht 5\' 8"  (1.727 m)   Wt 159 lb (72.1 kg)   SpO2 97%   BMI 24.18 kg/m   BP Readings from Last 3 Encounters:  05/02/16 130/86  02/01/16 124/84  06/23/15 116/70    Wt Readings from Last 3 Encounters:  05/02/16 159 lb (72.1 kg)  02/01/16 157 lb 8 oz (71.4 kg)  07/07/15 156 lb (70.8 kg)    Physical Exam  Constitutional: He is oriented to person, place, and time.  Non-toxic appearance. He does not have a sickly appearance. He does not appear ill. No distress.  HENT:  Mouth/Throat: Oropharynx is clear and  moist. No oropharyngeal exudate.    Eyes: Conjunctivae are normal. Right eye exhibits no discharge. Left eye exhibits no discharge. No scleral icterus.  Neck: Normal range of motion. Neck supple. No JVD present. No tracheal deviation present. No thyromegaly present.  Cardiovascular: Normal rate, regular rhythm, normal heart sounds and intact distal pulses.  Exam reveals no gallop and no friction rub.   No murmur  heard. Pulmonary/Chest: Effort normal and breath sounds normal. No stridor. No respiratory distress. He has no wheezes. He has no rales. He exhibits no tenderness.  Abdominal: Soft. Bowel sounds are normal. He exhibits no distension and no mass. There is no tenderness. There is no rebound and no guarding.  Musculoskeletal: Normal range of motion. He exhibits no edema, tenderness or deformity.  Lymphadenopathy:    He has no cervical adenopathy.  Neurological: He is alert and oriented to person, place, and time. He has normal strength. He displays no atrophy, no tremor and normal reflexes. No cranial nerve deficit or sensory deficit. He exhibits normal muscle tone. He displays a negative Romberg sign. He displays no seizure activity. Coordination and gait normal.  Reflex Scores:      Tricep reflexes are 1+ on the right side and 1+ on the left side.      Bicep reflexes are 1+ on the right side and 1+ on the left side.      Brachioradialis reflexes are 1+ on the right side and 1+ on the left side.      Patellar reflexes are 2+ on the right side and 2+ on the left side.      Achilles reflexes are 0 on the right side and 0 on the left side. Skin: Skin is warm and dry. No rash noted. He is not diaphoretic. No erythema. No pallor.  Vitals reviewed.   Lab Results  Component Value Date   WBC 8.0 05/02/2016   HGB 14.7 05/02/2016   HCT 43.1 05/02/2016   PLT 263.0 05/02/2016   GLUCOSE 102 (H) 05/02/2016   CHOL 218 (H) 02/01/2016   TRIG 93.0 02/01/2016   HDL 52.20 02/01/2016   LDLDIRECT 153.2 11/08/2008   LDLCALC 148 (H) 02/01/2016   ALT 15 05/02/2016   AST 15 05/02/2016   NA 140 05/02/2016   K 4.1 05/02/2016   CL 102 05/02/2016   CREATININE 1.13 05/02/2016   BUN 12 05/02/2016   CO2 30 05/02/2016   TSH 0.78 02/01/2016   PSA 2.96 02/01/2016   INR 1.0 03/29/2014   HGBA1C 5.5 06/28/2008    Mr Foot Right Wo Contrast  Result Date: 07/07/2015 CLINICAL DATA:  Pain in the ball of the foot for  3 months. No known injury or prior relevant surgery. History of neuromuscular disorder. EXAM: MRI OF THE RIGHT FOREFOOT WITHOUT CONTRAST TECHNIQUE: Multiplanar, multisequence MR imaging was performed. No intravenous contrast was administered. COMPARISON:  Radiographs 06/23/2015 FINDINGS: A capsule was placed over the area of pain, along the plantar aspect of the second web space. There is mild heterogeneity and lobularity of the subcutaneous fat both medial and lateral to the marker. No focal fluid collection, mass or inflammatory changes seen. There is minimal fluid within the flexor tendon of the second digit, within physiologic limits. There are minimal degenerative changes of the first metatarsal phalangeal joint associated with a small effusion. No erosive changes are seen. The additional metatarsal phalangeal and interphalangeal joints appear normal. The alignment is normal at the Lisfranc joint. There is no evidence of acute fracture or  dislocation. IMPRESSION: 1. No acute findings or clear explanation for the patient's symptoms. No evidence of Morton's neuroma on noncontrast imaging. 2. Mild nonspecific lobularity of the subcutaneous fat along the ball of the foot. 3. Minimal first MTP degenerative changes. Electronically Signed   By: Richardean Sale M.D.   On: 07/07/2015 09:11    Assessment & Plan:   Talion was seen today for osteoarthritis.  Diagnoses and all orders for this visit:  Lesion of tongue- will treat topically and refer to ENT to see if this needs to be biopsied to screen for oral cancer -     Ambulatory referral to ENT -     HIV antibody; Future -     Diphenhyd-Hydrocort-Nystatin (FIRST-DUKES MOUTHWASH) SUSP; Use as directed 5 mLs in the mouth or throat 4 (four) times daily as needed.  Paresthesia and pain of both upper extremities- his labs are negative for any inflammatory or infectious process but he is developing B12 deficiency. Will treat this. -     Lyme Ab/Western Blot  Reflex; Future -     CBC with Differential/Platelet; Future -     Comprehensive metabolic panel; Future -     Vitamin B12; Future -     Methylmalonic acid, serum; Future -     HIV antibody; Future -     CK; Future  Arthralgia, unspecified joint- his labs are negative for any inflammatory or infectious process. This is most likely osteoarthritis and will treat symptomatically. -     Lyme Ab/Western Blot Reflex; Future -     Sedimentation rate; Future -     C-reactive protein; Future -     Cyclic citrul peptide antibody, IgG; Future -     ANA; Future -     CK; Future  Prostatitis, acute- this has resolved -     Urinalysis, Routine w reflex microscopic; Future  Hematuria, microscopic -     Urinalysis, Routine w reflex microscopic; Future  Vitamin B12 deficiency neuropathy ([place])- will start treating this with parenteral B12, I thousand micrograms weekly for 3 weeks and then monthly. I think this explains the symptoms in his upper extremities. If the symptoms don't improve soon then will consider getting a NCS/EMG.   I have discontinued Mr. Brearley's levofloxacin. I am also having him start on FIRST-DUKES MOUTHWASH. Additionally, I am having him maintain his lamoTRIgine, diazepam, meloxicam, traMADol, and QUEtiapine.  Meds ordered this encounter  Medications  . QUEtiapine (SEROQUEL) 400 MG tablet    Sig: TK 1 AND 1/2 TS PO QHS    Refill:  4  . Diphenhyd-Hydrocort-Nystatin (FIRST-DUKES MOUTHWASH) SUSP    Sig: Use as directed 5 mLs in the mouth or throat 4 (four) times daily as needed.    Dispense:  237 mL    Refill:  0     Follow-up: Return in about 3 weeks (around 05/23/2016).  Scarlette Calico, MD

## 2016-05-03 LAB — CYCLIC CITRUL PEPTIDE ANTIBODY, IGG: CYCLIC CITRULLIN PEPTIDE AB: 16 U

## 2016-05-03 LAB — LYME AB/WESTERN BLOT REFLEX

## 2016-05-03 LAB — HIV ANTIBODY (ROUTINE TESTING W REFLEX): HIV 1&2 Ab, 4th Generation: NONREACTIVE

## 2016-05-04 LAB — ANA: ANA: NEGATIVE

## 2016-05-05 ENCOUNTER — Encounter: Payer: Self-pay | Admitting: Internal Medicine

## 2016-05-05 DIAGNOSIS — E538 Deficiency of other specified B group vitamins: Secondary | ICD-10-CM | POA: Insufficient documentation

## 2016-05-05 DIAGNOSIS — G63 Polyneuropathy in diseases classified elsewhere: Secondary | ICD-10-CM

## 2016-05-06 MED ORDER — FIRST-DUKES MOUTHWASH MT SUSP: 5.0000 mL | Freq: Four times a day (QID) | OROMUCOSAL | 0 refills | Status: DC | PRN

## 2016-05-08 ENCOUNTER — Telehealth: Payer: Self-pay | Admitting: *Deleted

## 2016-05-08 NOTE — Telephone Encounter (Signed)
Yes MMW is fine

## 2016-05-08 NOTE — Telephone Encounter (Signed)
Pharmacist left msg on triage stating the kit for (FIRST-DUKES MOUTHWASH)  is no longer available. Requesting ingredients MD want to use...[name]

## 2016-05-08 NOTE — Telephone Encounter (Signed)
Called pharmacy back spoke w/pharmacist " Prea" she is wanting to know or you ordering the regular " Magic Mouth wash which include benadryl, hydrocortisone, and nystatin sol. If you are wanting the ingredient listed below added she states she will need to know the strength of the prednisolone & lidocaine...[name]

## 2016-05-08 NOTE — Telephone Encounter (Signed)
Notified Prea w/MD response...Colton Andersen

## 2016-05-08 NOTE — Telephone Encounter (Signed)
Equal parts - benadryl liquid, nystatin susp, viscous lidocaine, prelone liquid

## 2016-05-09 ENCOUNTER — Ambulatory Visit (INDEPENDENT_AMBULATORY_CARE_PROVIDER_SITE_OTHER): Payer: BLUE CROSS/BLUE SHIELD

## 2016-05-09 ENCOUNTER — Ambulatory Visit: Payer: BLUE CROSS/BLUE SHIELD

## 2016-05-09 ENCOUNTER — Telehealth: Payer: Self-pay

## 2016-05-09 DIAGNOSIS — E538 Deficiency of other specified B group vitamins: Secondary | ICD-10-CM | POA: Diagnosis not present

## 2016-05-09 LAB — METHYLMALONIC ACID, SERUM: Methylmalonic Acid, Quant: 153 nmol/L (ref 87–318)

## 2016-05-09 MED ORDER — CYANOCOBALAMIN 1000 MCG/ML IJ SOLN
1000.0000 ug | Freq: Once | INTRAMUSCULAR | Status: AC
  Administered 2016-05-09: 1000 ug via INTRAMUSCULAR

## 2016-05-09 NOTE — Telephone Encounter (Signed)
Advised patient of dr Ronnald Ramp instructions, I have made next 3 nurse visits per patient request

## 2016-05-09 NOTE — Telephone Encounter (Signed)
Weekly for 3 weeks then monthly

## 2016-05-09 NOTE — Telephone Encounter (Signed)
Patient came in today (nurse visit) for 1st b12 injection----how often/how many injections should patient start getting----please advise, I will call patient back---thanks

## 2016-05-14 ENCOUNTER — Other Ambulatory Visit: Payer: Self-pay | Admitting: Internal Medicine

## 2016-05-14 ENCOUNTER — Telehealth: Payer: Self-pay | Admitting: *Deleted

## 2016-05-14 NOTE — Telephone Encounter (Signed)
Wife left msg on triage stating husband miss and spilled the mouth wash over the weekend. Requesting to see if MD can give refill...Colton Andersen

## 2016-05-14 NOTE — Telephone Encounter (Signed)
Called [facility] spoke w/Kristen ok refill for the magic mouth wash...[name]

## 2016-05-14 NOTE — Telephone Encounter (Signed)
Yes, please call the pharmacy

## 2016-05-15 ENCOUNTER — Ambulatory Visit (INDEPENDENT_AMBULATORY_CARE_PROVIDER_SITE_OTHER): Payer: BLUE CROSS/BLUE SHIELD | Admitting: Emergency Medicine

## 2016-05-15 DIAGNOSIS — G63 Polyneuropathy in diseases classified elsewhere: Secondary | ICD-10-CM

## 2016-05-15 DIAGNOSIS — F3163 Bipolar disorder, current episode mixed, severe, without psychotic features: Secondary | ICD-10-CM | POA: Diagnosis not present

## 2016-05-15 DIAGNOSIS — E538 Deficiency of other specified B group vitamins: Secondary | ICD-10-CM | POA: Diagnosis not present

## 2016-05-15 MED ORDER — CYANOCOBALAMIN 1000 MCG/ML IJ SOLN
1000.0000 ug | Freq: Once | INTRAMUSCULAR | Status: AC
  Administered 2016-05-15: 1000 ug via INTRAMUSCULAR

## 2016-05-16 ENCOUNTER — Ambulatory Visit: Payer: BLUE CROSS/BLUE SHIELD

## 2016-05-22 ENCOUNTER — Ambulatory Visit (INDEPENDENT_AMBULATORY_CARE_PROVIDER_SITE_OTHER): Payer: BLUE CROSS/BLUE SHIELD

## 2016-05-22 DIAGNOSIS — E538 Deficiency of other specified B group vitamins: Secondary | ICD-10-CM

## 2016-05-22 MED ORDER — CYANOCOBALAMIN 1000 MCG/ML IJ SOLN
1000.0000 ug | Freq: Once | INTRAMUSCULAR | Status: AC
  Administered 2016-05-22: 1000 ug via INTRAMUSCULAR

## 2016-05-29 ENCOUNTER — Ambulatory Visit (INDEPENDENT_AMBULATORY_CARE_PROVIDER_SITE_OTHER): Payer: BLUE CROSS/BLUE SHIELD | Admitting: General Practice

## 2016-05-29 DIAGNOSIS — E538 Deficiency of other specified B group vitamins: Secondary | ICD-10-CM | POA: Diagnosis not present

## 2016-05-29 MED ORDER — CYANOCOBALAMIN 1000 MCG/ML IJ SOLN
1000.0000 ug | Freq: Once | INTRAMUSCULAR | Status: AC
  Administered 2016-05-29: 1000 ug via INTRAMUSCULAR

## 2016-06-12 ENCOUNTER — Ambulatory Visit (INDEPENDENT_AMBULATORY_CARE_PROVIDER_SITE_OTHER): Payer: BLUE CROSS/BLUE SHIELD | Admitting: General Practice

## 2016-06-12 DIAGNOSIS — Z87891 Personal history of nicotine dependence: Secondary | ICD-10-CM | POA: Diagnosis not present

## 2016-06-12 DIAGNOSIS — Z09 Encounter for follow-up examination after completed treatment for conditions other than malignant neoplasm: Secondary | ICD-10-CM | POA: Diagnosis not present

## 2016-06-12 DIAGNOSIS — Z8669 Personal history of other diseases of the nervous system and sense organs: Secondary | ICD-10-CM | POA: Diagnosis not present

## 2016-06-12 DIAGNOSIS — E538 Deficiency of other specified B group vitamins: Secondary | ICD-10-CM

## 2016-06-12 DIAGNOSIS — J343 Hypertrophy of nasal turbinates: Secondary | ICD-10-CM | POA: Diagnosis not present

## 2016-06-12 MED ORDER — CYANOCOBALAMIN 1000 MCG/ML IJ SOLN
1000.0000 ug | Freq: Once | INTRAMUSCULAR | Status: AC
  Administered 2016-06-12: 1000 ug via INTRAMUSCULAR

## 2016-06-21 ENCOUNTER — Encounter: Payer: Self-pay | Admitting: Gastroenterology

## 2016-06-26 ENCOUNTER — Ambulatory Visit (INDEPENDENT_AMBULATORY_CARE_PROVIDER_SITE_OTHER): Payer: BLUE CROSS/BLUE SHIELD | Admitting: General Practice

## 2016-06-26 DIAGNOSIS — E538 Deficiency of other specified B group vitamins: Secondary | ICD-10-CM

## 2016-06-26 MED ORDER — CYANOCOBALAMIN 1000 MCG/ML IJ SOLN
1000.0000 ug | Freq: Once | INTRAMUSCULAR | Status: AC
  Administered 2016-06-26: 1000 ug via INTRAMUSCULAR

## 2016-07-05 ENCOUNTER — Telehealth: Payer: Self-pay | Admitting: General Practice

## 2016-07-05 NOTE — Telephone Encounter (Signed)
Pt called in would like labs put in to check not only b12 but whole panel to see what levels are and if infection is gone.  Explain to him that last visit he was suppose to fu in 3 weeks,  He didn't want to make an appt.  He just wanted labs put in so he could get them the day he comes in for b12 (4/17)

## 2016-07-06 NOTE — Telephone Encounter (Signed)
Called pt. Asked what panels he wanted to have done. Pt stated all of the panels that was done at last visit. I informed pt that there a lot of panels done. Pt was wanting to know why there was no follow up on the blood work done in February. I informed that the paper work given requested a follow up in 3 weeks.   Pt has made an appt to follow up.

## 2016-07-06 NOTE — Telephone Encounter (Signed)
Which labs does he want to check?

## 2016-07-10 ENCOUNTER — Ambulatory Visit (INDEPENDENT_AMBULATORY_CARE_PROVIDER_SITE_OTHER): Payer: BLUE CROSS/BLUE SHIELD | Admitting: General Practice

## 2016-07-10 DIAGNOSIS — E538 Deficiency of other specified B group vitamins: Secondary | ICD-10-CM

## 2016-07-10 MED ORDER — CYANOCOBALAMIN 1000 MCG/ML IJ SOLN
1000.0000 ug | Freq: Once | INTRAMUSCULAR | Status: AC
  Administered 2016-07-10: 1000 ug via INTRAMUSCULAR

## 2016-07-11 ENCOUNTER — Ambulatory Visit: Payer: BLUE CROSS/BLUE SHIELD | Admitting: Internal Medicine

## 2016-07-12 ENCOUNTER — Ambulatory Visit: Payer: BLUE CROSS/BLUE SHIELD | Admitting: Internal Medicine

## 2016-08-06 ENCOUNTER — Encounter (HOSPITAL_COMMUNITY): Payer: Self-pay | Admitting: Emergency Medicine

## 2016-08-06 ENCOUNTER — Emergency Department (HOSPITAL_COMMUNITY)
Admission: EM | Admit: 2016-08-06 | Discharge: 2016-08-06 | Disposition: A | Discharge status: Home / Self Care | Payer: BLUE CROSS/BLUE SHIELD | Attending: Emergency Medicine | Admitting: Emergency Medicine

## 2016-08-06 DIAGNOSIS — Z87891 Personal history of nicotine dependence: Secondary | ICD-10-CM | POA: Diagnosis not present

## 2016-08-06 DIAGNOSIS — R1031 Right lower quadrant pain: Secondary | ICD-10-CM | POA: Insufficient documentation

## 2016-08-06 DIAGNOSIS — Z5321 Procedure and treatment not carried out due to patient leaving prior to being seen by health care provider: Secondary | ICD-10-CM | POA: Insufficient documentation

## 2016-08-06 DIAGNOSIS — Z79899 Other long term (current) drug therapy: Secondary | ICD-10-CM | POA: Diagnosis not present

## 2016-08-06 LAB — CBC
HEMATOCRIT: 38.2 % — AB (ref 39.0–52.0)
HEMOGLOBIN: 13.3 g/dL (ref 13.0–17.0)
MCH: 30.8 pg (ref 26.0–34.0)
MCHC: 34.8 g/dL (ref 30.0–36.0)
MCV: 88.4 fL (ref 78.0–100.0)
Platelets: 258 10*3/uL (ref 150–400)
RBC: 4.32 MIL/uL (ref 4.22–5.81)
RDW: 13.4 % (ref 11.5–15.5)
WBC: 8.5 10*3/uL (ref 4.0–10.5)

## 2016-08-06 LAB — COMPREHENSIVE METABOLIC PANEL
ALT: 24 U/L (ref 17–63)
AST: 25 U/L (ref 15–41)
Albumin: 5.1 g/dL — ABNORMAL HIGH (ref 3.5–5.0)
Alkaline Phosphatase: 19 U/L — ABNORMAL LOW (ref 38–126)
Anion gap: 9 (ref 5–15)
BILIRUBIN TOTAL: 0.5 mg/dL (ref 0.3–1.2)
BUN: 18 mg/dL (ref 6–20)
CHLORIDE: 104 mmol/L (ref 101–111)
CO2: 27 mmol/L (ref 22–32)
Calcium: 9.8 mg/dL (ref 8.9–10.3)
Creatinine, Ser: 1.19 mg/dL (ref 0.61–1.24)
GFR calc non Af Amer: >60 mL/min (ref >60)
Glucose, Bld: 103 mg/dL — ABNORMAL HIGH (ref 65–99)
Potassium: 5 mmol/L (ref 3.5–5.1)
Sodium: 140 mmol/L (ref 135–145)
Total Protein: 8.2 g/dL — ABNORMAL HIGH (ref 6.5–8.1)

## 2016-08-06 LAB — LIPASE, BLOOD: LIPASE: 40 U/L (ref 11–51)

## 2016-08-06 NOTE — ED Notes (Signed)
Per Registration, pt left before being seen

## 2016-08-06 NOTE — ED Triage Notes (Signed)
Patient c/o RLQ pain that started last night and gotten worse today. Patient reports being seen at an Urgent care in [facility] and was told probably appendicitis and to go to Ed for further evaluation.

## 2016-08-07 ENCOUNTER — Ambulatory Visit (INDEPENDENT_AMBULATORY_CARE_PROVIDER_SITE_OTHER): Payer: BLUE CROSS/BLUE SHIELD | Admitting: General Practice

## 2016-08-07 DIAGNOSIS — E538 Deficiency of other specified B group vitamins: Secondary | ICD-10-CM | POA: Diagnosis not present

## 2016-08-07 MED ORDER — CYANOCOBALAMIN 1000 MCG/ML IJ SOLN
1000.0000 ug | Freq: Once | INTRAMUSCULAR | Status: AC
  Administered 2016-08-07: 1000 ug via INTRAMUSCULAR

## 2016-09-04 ENCOUNTER — Encounter: Payer: Self-pay | Admitting: Internal Medicine

## 2016-09-04 ENCOUNTER — Ambulatory Visit: Payer: BLUE CROSS/BLUE SHIELD

## 2016-09-04 ENCOUNTER — Ambulatory Visit (INDEPENDENT_AMBULATORY_CARE_PROVIDER_SITE_OTHER): Payer: BLUE CROSS/BLUE SHIELD | Admitting: Internal Medicine

## 2016-09-04 VITALS — BP 140/80 | HR 87 | Temp 97.9°F | Resp 16 | Ht 68.0 in | Wt 161.5 lb

## 2016-09-04 DIAGNOSIS — E538 Deficiency of other specified B group vitamins: Secondary | ICD-10-CM

## 2016-09-04 DIAGNOSIS — M150 Primary generalized (osteo)arthritis: Secondary | ICD-10-CM

## 2016-09-04 DIAGNOSIS — G63 Polyneuropathy in diseases classified elsewhere: Secondary | ICD-10-CM

## 2016-09-04 DIAGNOSIS — K635 Polyp of colon: Secondary | ICD-10-CM

## 2016-09-04 MED ORDER — ZOSTER VAC RECOMB ADJUVANTED 50 MCG/0.5ML IM SUSR: 0.5000 mL | Freq: Once | INTRAMUSCULAR | 1 refills | Status: AC

## 2016-09-04 MED ORDER — CYANOCOBALAMIN 2000 MCG PO TABS: 2000.0000 ug | ORAL_TABLET | Freq: Every day | ORAL | 3 refills | Status: DC

## 2016-09-04 MED ORDER — CYANOCOBALAMIN 1000 MCG/ML IJ SOLN
1000.0000 ug | Freq: Once | INTRAMUSCULAR | Status: AC
  Administered 2016-09-04: 1000 ug via INTRAMUSCULAR

## 2016-09-04 NOTE — Progress Notes (Signed)
Subjective:  Patient ID: Colton Andersen, male    DOB: May 10, 1960  Age: 56 y.o. MRN: [id number]  CC: No chief complaint on file.   HPI Colton Andersen presents for duplicate note  Outpatient Medications Prior to Visit  Medication Sig Dispense Refill  . diazepam (VALIUM) 10 MG tablet Take 10 mg by mouth.    . lamoTRIgine (LAMICTAL) 200 MG tablet Take 200 mg by mouth daily.    . meloxicam (MOBIC) 15 MG tablet TAKE 1 TABLET(15 MG) BY MOUTH DAILY 30 tablet 5  . QUEtiapine (SEROQUEL) 400 MG tablet TK 1 AND 1/2 TS PO QHS  4  . traMADol (ULTRAM) 50 MG tablet TAKE 1 TABLET BY MOUTH EVERY 8 HOURS AS NEEDED FOR PAIN 50 tablet 5   No facility-administered medications prior to visit.     ROS Review of Systems  Objective:  There were no vitals taken for this visit.  BP Readings from Last 3 Encounters:  09/04/16 140/80  08/06/16 (!) 169/97  05/02/16 130/86    Wt Readings from Last 3 Encounters:  09/04/16 161 lb 8 oz (73.3 kg)  08/06/16 160 lb (72.6 kg)  05/02/16 159 lb (72.1 kg)    Physical Exam  Lab Results  Component Value Date   WBC 8.5 08/06/2016   HGB 13.3 08/06/2016   HCT 38.2 (L) 08/06/2016   PLT 258 08/06/2016   GLUCOSE 103 (H) 08/06/2016   CHOL 218 (H) 02/01/2016   TRIG 93.0 02/01/2016   HDL 52.20 02/01/2016   LDLDIRECT 153.2 11/08/2008   LDLCALC 148 (H) 02/01/2016   ALT 24 08/06/2016   AST 25 08/06/2016   NA 140 08/06/2016   K 5.0 08/06/2016   CL 104 08/06/2016   CREATININE 1.19 08/06/2016   BUN 18 08/06/2016   CO2 27 08/06/2016   TSH 0.78 02/01/2016   PSA 2.96 02/01/2016   INR 1.0 03/29/2014   HGBA1C 5.5 06/28/2008    Mr Foot Right Wo Contrast  Result Date: 07/07/2015 CLINICAL DATA:  Pain in the ball of the foot for 3 months. No known injury or prior relevant surgery. History of neuromuscular disorder. EXAM: MRI OF THE RIGHT FOREFOOT WITHOUT CONTRAST TECHNIQUE: Multiplanar, multisequence MR imaging was performed. No intravenous contrast was  administered. COMPARISON:  Radiographs 06/23/2015 FINDINGS: A capsule was placed over the area of pain, along the plantar aspect of the second web space. There is mild heterogeneity and lobularity of the subcutaneous fat both medial and lateral to the marker. No focal fluid collection, mass or inflammatory changes seen. There is minimal fluid within the flexor tendon of the second digit, within physiologic limits. There are minimal degenerative changes of the first metatarsal phalangeal joint associated with a small effusion. No erosive changes are seen. The additional metatarsal phalangeal and interphalangeal joints appear normal. The alignment is normal at the Lisfranc joint. There is no evidence of acute fracture or dislocation. IMPRESSION: 1. No acute findings or clear explanation for the patient's symptoms. No evidence of Morton's neuroma on noncontrast imaging. 2. Mild nonspecific lobularity of the subcutaneous fat along the ball of the foot. 3. Minimal first MTP degenerative changes. Electronically Signed   By: Richardean Sale M.D.   On: 07/07/2015 09:11    Assessment & Plan:   Diagnoses and all orders for this visit:  Primary osteoarthritis involving multiple joints   I am having Mr. Colton Andersen maintain his lamoTRIgine, diazepam, meloxicam, traMADol, and QUEtiapine.  No orders of the defined types were placed in this  encounter.    Follow-up: No Follow-up on file.  Scarlette Calico, MD

## 2016-09-04 NOTE — Progress Notes (Signed)
Subjective:  Patient ID: Colton Andersen, male    DOB: [birth year]  Age: 56 y.o. MRN: [id number]  CC: Anemia   HPI Colton Andersen presents for f/up on B12 deficiency. He complains that in between his B12 injections he is starting to develop paresthesias in his arms and he wants to know if there is something he can do to supplement the B12 level in between the injections. He otherwise feels well and offers no other complaints.  Outpatient Medications Prior to Visit  Medication Sig Dispense Refill  . diazepam (VALIUM) 10 MG tablet Take 10 mg by mouth.    . lamoTRIgine (LAMICTAL) 200 MG tablet Take 200 mg by mouth daily.    . meloxicam (MOBIC) 15 MG tablet TAKE 1 TABLET(15 MG) BY MOUTH DAILY 30 tablet 5  . QUEtiapine (SEROQUEL) 400 MG tablet TK 1 AND 1/2 TS PO QHS  4  . traMADol (ULTRAM) 50 MG tablet TAKE 1 TABLET BY MOUTH EVERY 8 HOURS AS NEEDED FOR PAIN 50 tablet 5   No facility-administered medications prior to visit.     ROS Review of Systems  Constitutional: Negative.  Negative for appetite change, diaphoresis, fatigue and unexpected weight change.  HENT: Negative.  Negative for trouble swallowing.   Eyes: Negative for visual disturbance.  Respiratory: Negative for cough, chest tightness, shortness of breath and wheezing.   Cardiovascular: Negative for chest pain, palpitations and leg swelling.  Gastrointestinal: Negative for abdominal pain, blood in stool, constipation, diarrhea, nausea and vomiting.  Endocrine: Negative.   Genitourinary: Negative.   Musculoskeletal: Positive for arthralgias.  Neurological: Positive for numbness (tingling). Negative for dizziness, weakness and headaches.  Hematological: Negative for adenopathy. Does not bruise/bleed easily.  Psychiatric/Behavioral: Negative.     Objective:  BP 140/80 (BP Location: Left Arm, Patient Position: Sitting, Cuff Size: Normal)   Pulse 87   Temp 97.9 F (36.6 C) (Oral)   Resp 16   Ht 5\' 8"  (1.727 m)   Wt 161  lb 8 oz (73.3 kg)   SpO2 99%   BMI 24.56 kg/m   BP Readings from Last 3 Encounters:  09/04/16 140/80  08/06/16 (!) 169/97  05/02/16 130/86    Wt Readings from Last 3 Encounters:  09/04/16 161 lb 8 oz (73.3 kg)  08/06/16 160 lb (72.6 kg)  05/02/16 159 lb (72.1 kg)    Physical Exam  Constitutional: He is oriented to person, place, and time. No distress.  HENT:  Mouth/Throat: Oropharynx is clear and moist. No oropharyngeal exudate.  Eyes: Conjunctivae are normal. Right eye exhibits no discharge. Left eye exhibits no discharge. No scleral icterus.  Neck: Normal range of motion. Neck supple. No JVD present. No thyromegaly present.  Cardiovascular: Normal rate, regular rhythm and intact distal pulses.  Exam reveals no gallop.   No murmur heard. Pulmonary/Chest: Effort normal and breath sounds normal. No respiratory distress. He has no wheezes. He has no rales. He exhibits no tenderness.  Abdominal: Soft. Bowel sounds are normal. He exhibits no distension and no mass. There is no tenderness. There is no rebound and no guarding.  Musculoskeletal: Normal range of motion. He exhibits no edema, tenderness or deformity.  Lymphadenopathy:    He has no cervical adenopathy.  Neurological: He is alert and oriented to person, place, and time. He has normal reflexes. He displays normal reflexes. No cranial nerve deficit. He exhibits normal muscle tone. Coordination normal.  Skin: Skin is warm and dry. No rash noted. He is not diaphoretic. No  erythema. No pallor.  Vitals reviewed.   Lab Results  Component Value Date   WBC 8.5 08/06/2016   HGB 13.3 08/06/2016   HCT 38.2 (L) 08/06/2016   PLT 258 08/06/2016   GLUCOSE 103 (H) 08/06/2016   CHOL 218 (H) 02/01/2016   TRIG 93.0 02/01/2016   HDL 52.20 02/01/2016   LDLDIRECT 153.2 11/08/2008   LDLCALC 148 (H) 02/01/2016   ALT 24 08/06/2016   AST 25 08/06/2016   NA 140 08/06/2016   K 5.0 08/06/2016   CL 104 08/06/2016   CREATININE 1.19  08/06/2016   BUN 18 08/06/2016   CO2 27 08/06/2016   TSH 0.78 02/01/2016   PSA 2.96 02/01/2016   INR 1.0 03/29/2014   HGBA1C 5.5 06/28/2008    Mr Foot Right Wo Contrast  Result Date: 07/07/2015 CLINICAL DATA:  Pain in the ball of the foot for 3 months. No known injury or prior relevant surgery. History of neuromuscular disorder. EXAM: MRI OF THE RIGHT FOREFOOT WITHOUT CONTRAST TECHNIQUE: Multiplanar, multisequence MR imaging was performed. No intravenous contrast was administered. COMPARISON:  Radiographs 06/23/2015 FINDINGS: A capsule was placed over the area of pain, along the plantar aspect of the second web space. There is mild heterogeneity and lobularity of the subcutaneous fat both medial and lateral to the marker. No focal fluid collection, mass or inflammatory changes seen. There is minimal fluid within the flexor tendon of the second digit, within physiologic limits. There are minimal degenerative changes of the first metatarsal phalangeal joint associated with a small effusion. No erosive changes are seen. The additional metatarsal phalangeal and interphalangeal joints appear normal. The alignment is normal at the Lisfranc joint. There is no evidence of acute fracture or dislocation. IMPRESSION: 1. No acute findings or clear explanation for the patient's symptoms. No evidence of Morton's neuroma on noncontrast imaging. 2. Mild nonspecific lobularity of the subcutaneous fat along the ball of the foot. 3. Minimal first MTP degenerative changes. Electronically Signed   By: Richardean Sale M.D.   On: 07/07/2015 09:11    Assessment & Plan:   Rigdon was seen today for anemia.  Diagnoses and all orders for this visit:  Vitamin B12 deficiency neuropathy ([place])- he will add an oral B12 supplement to the injections and he will return in 3-4 weeks for his next B12 injection to see what kind of a B12 level he is achieving with this combination. In the meantime he will let me know if he develops any  new or worsening symptoms. -     cyanocobalamin ((VITAMIN B-12)) injection 1,000 mcg; Inject 1 mL (1,000 mcg total) into the muscle once. -     CBC with Differential/Platelet; Future -     Folate; Future -     Vitamin B12; Future -     cyanocobalamin 2000 MCG tablet; Take 1 tablet (2,000 mcg total) by mouth daily.  Polyp of colon, unspecified part of colon, unspecified type -     Ambulatory referral to Gastroenterology  Other orders -     Zoster Vac Recomb Adjuvanted [facility]) injection; Inject 0.5 mLs into the muscle once.   I am having Mr. Dommer start on cyanocobalamin and Zoster Vac Recomb Adjuvanted. I am also having him maintain his lamoTRIgine, diazepam, meloxicam, traMADol, and QUEtiapine. We administered cyanocobalamin.  Meds ordered this encounter  Medications  . cyanocobalamin ((VITAMIN B-12)) injection 1,000 mcg  . cyanocobalamin 2000 MCG tablet    Sig: Take 1 tablet (2,000 mcg total) by mouth daily.  Dispense:  90 tablet    Refill:  3  . Zoster Vac Recomb Adjuvanted [facility]) injection    Sig: Inject 0.5 mLs into the muscle once.    Dispense:  1 each    Refill:  1     Follow-up: Return in about 4 weeks (around 10/02/2016).  Scarlette Calico, MD

## 2016-09-04 NOTE — Patient Instructions (Signed)
Vitamin B12 Deficiency Vitamin B12 deficiency occurs when the body does not have enough vitamin B12. Vitamin B12 is an important vitamin. The body needs vitamin B12:  To make red blood cells.  To make DNA. This is the genetic material inside cells.  To help the nerves work properly so they can carry messages from the brain to the body.  Vitamin B12 deficiency can cause various health problems, such as a low red blood cell count (anemia) or nerve damage. What are the causes? This condition may be caused by:  Not eating enough foods that contain vitamin B12.  Not having enough stomach acid and digestive fluids to properly absorb vitamin B12 from the food that you eat.  Certain digestive system diseases that make it hard to absorb vitamin B12. These diseases include Crohn disease, chronic pancreatitis, and cystic fibrosis.  Pernicious anemia. This is a condition in which the body does not make enough of a protein (intrinsic factor), resulting in too few red blood cells.  Having a surgery in which part of the stomach or small intestine is removed.  Taking certain medicines that make it hard for the body to absorb vitamin B12. These medicines include: ? Heartburn medicine (antacids and proton pump inhibitors). ? An antibiotic medicine called neomycin. ? Some medicines that are used to treat diabetes, tuberculosis, gout, or high cholesterol.  What increases the risk? The following factors may make you more likely to develop a B12 deficiency:  Being older than age 50.  Eating a vegetarian or vegan diet, especially while you are pregnant.  Eating a poor diet while you are pregnant.  Taking certain drugs.  Having alcoholism.  What are the signs or symptoms? In some cases, there are no symptoms of this condition. If the condition leads to anemia or nerve damage, various symptoms can occur, such as:  Weakness.  Fatigue.  Loss of appetite.  Weight loss.  Numbness or tingling  in your hands and feet.  Redness and burning of the tongue.  Confusion or memory problems.  Depression.  Sensory problems, such as color blindness, ringing in the ears, or loss of taste.  Diarrhea or constipation.  Trouble walking.  If anemia is severe, symptoms can include:  Shortness of breath.  Dizziness.  Rapid heart rate (tachycardia).  How is this diagnosed? This condition may be diagnosed with a blood test to measure the level of vitamin B12 in your blood. You may have other tests to help find the cause of your vitamin B12 deficiency. These tests may include:  A complete blood count (CBC). This is a group of tests that measure certain characteristics of blood cells.  A blood test to measure intrinsic factor.  An endoscopy. In this procedure, a thin tube with a camera on the end is used to look into your stomach or intestines.  How is this treated? Treatment for this condition depends on the cause. Common treatment options include:  Changing your eating and drinking habits, such as: ? Eating more foods that contain vitamin B12. ? Drinking less alcohol or no alcohol.  Taking vitamin B12 supplements. Your health care provider will tell you which dosage is best for you.  Getting vitamin B12 injections.  Follow these instructions at home:  Take supplements only as told by your health care provider. Follow the directions carefully.  Get any injections that are prescribed by your health care provider.  Do not miss your appointments.  Eat lots of healthy foods that contain vitamin B12.   Ask your health care provider if you should work with a dietitian. Foods that contain vitamin B12 include: ? Meat. ? Meat from birds (poultry). ? Fish. ? Eggs. ? Cereal and dairy products that are fortified. This means that vitamin B12 has been added to the food. Check the label on the package to see if the food is fortified.  Do not abuse alcohol.  Keep all follow-up visits as  told by your health care provider. This is important. Contact a health care provider if:  Your symptoms come back. Get help right away if:  You develop shortness of breath.  You have chest pain.  You become dizzy or you lose consciousness. This information is not intended to replace advice given to you by your health care provider. Make sure you discuss any questions you have with your health care provider. Document Released: 06/04/2011 Document Revised: 08/24/2015 Document Reviewed: 07/28/2014 Elsevier Interactive Patient Education  2018 [employer] Inc[employer]  

## 2016-09-05 ENCOUNTER — Encounter: Payer: Self-pay | Admitting: Gastroenterology

## 2016-09-06 ENCOUNTER — Telehealth: Payer: Self-pay | Admitting: Internal Medicine

## 2016-09-06 DIAGNOSIS — G63 Polyneuropathy in diseases classified elsewhere: Principal | ICD-10-CM

## 2016-09-06 DIAGNOSIS — E538 Deficiency of other specified B group vitamins: Secondary | ICD-10-CM

## 2016-09-06 MED ORDER — CYANOCOBALAMIN 2000 MCG PO TABS: 2000.0000 ug | ORAL_TABLET | Freq: Every day | ORAL | 3 refills | Status: DC

## 2016-09-06 NOTE — Telephone Encounter (Signed)
Please resend cyanocobalamin 2000 MCG tablet  To [facility] in [place] on [address]   They did not receive it

## 2016-09-06 NOTE — Telephone Encounter (Signed)
Resent rx as requested.   

## 2016-09-21 ENCOUNTER — Other Ambulatory Visit: Payer: Self-pay | Admitting: Internal Medicine

## 2016-09-22 ENCOUNTER — Other Ambulatory Visit: Payer: Self-pay | Admitting: Internal Medicine

## 2016-09-22 DIAGNOSIS — M25571 Pain in right ankle and joints of right foot: Secondary | ICD-10-CM

## 2016-09-24 NOTE — Telephone Encounter (Signed)
Faxed script back to [facility].../lmb 

## 2016-10-01 ENCOUNTER — Telehealth: Payer: Self-pay | Admitting: Internal Medicine

## 2016-10-01 DIAGNOSIS — M25469 Effusion, unspecified knee: Secondary | ICD-10-CM

## 2016-10-01 NOTE — Telephone Encounter (Signed)
Referral entered  
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
## 2016-10-02 ENCOUNTER — Ambulatory Visit (INDEPENDENT_AMBULATORY_CARE_PROVIDER_SITE_OTHER): Payer: BLUE CROSS/BLUE SHIELD

## 2016-10-02 ENCOUNTER — Other Ambulatory Visit (INDEPENDENT_AMBULATORY_CARE_PROVIDER_SITE_OTHER): Payer: BLUE CROSS/BLUE SHIELD

## 2016-10-02 DIAGNOSIS — G63 Polyneuropathy in diseases classified elsewhere: Secondary | ICD-10-CM

## 2016-10-02 DIAGNOSIS — E538 Deficiency of other specified B group vitamins: Secondary | ICD-10-CM

## 2016-10-02 LAB — CBC WITH DIFFERENTIAL/PLATELET
BASOS ABS: 0 10*3/uL (ref 0.0–0.1)
Basophils Relative: 0.4 % (ref 0.0–3.0)
EOS ABS: 0.3 10*3/uL (ref 0.0–0.7)
EOS PCT: 4.2 % (ref 0.0–5.0)
HCT: 40.2 % (ref 39.0–52.0)
HEMOGLOBIN: 13.9 g/dL (ref 13.0–17.0)
LYMPHS ABS: 2.2 10*3/uL (ref 0.7–4.0)
Lymphocytes Relative: 27.3 % (ref 12.0–46.0)
MCHC: 34.6 g/dL (ref 30.0–36.0)
MCV: 88.6 fl (ref 78.0–100.0)
MONO ABS: 0.7 10*3/uL (ref 0.1–1.0)
Monocytes Relative: 8.4 % (ref 3.0–12.0)
Neutro Abs: 4.9 10*3/uL (ref 1.4–7.7)
Neutrophils Relative %: 59.7 % (ref 43.0–77.0)
Platelets: 234 10*3/uL (ref 150.0–400.0)
RBC: 4.53 Mil/uL (ref 4.22–5.81)
RDW: 13.5 % (ref 11.5–15.5)
WBC: 8.2 10*3/uL (ref 4.0–10.5)

## 2016-10-02 LAB — VITAMIN B12: VITAMIN B 12: 1023 pg/mL — AB (ref 211–911)

## 2016-10-02 LAB — FOLATE: FOLATE: 15.2 ng/mL (ref >5.9)

## 2016-10-02 MED ORDER — CYANOCOBALAMIN 1000 MCG/ML IJ SOLN
1000.0000 ug | Freq: Once | INTRAMUSCULAR | Status: AC
  Administered 2016-10-02: 1000 ug via INTRAMUSCULAR

## 2016-10-04 ENCOUNTER — Telehealth: Payer: Self-pay

## 2016-10-04 NOTE — Telephone Encounter (Signed)
Pt has been scheduled with Dr. Tamala Julian.  I tried to make a note in the referral but it would not let me.
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
## 2016-10-29 ENCOUNTER — Encounter: Payer: Self-pay | Admitting: Family Medicine

## 2016-10-29 ENCOUNTER — Ambulatory Visit: Payer: Self-pay

## 2016-10-29 ENCOUNTER — Ambulatory Visit (INDEPENDENT_AMBULATORY_CARE_PROVIDER_SITE_OTHER): Payer: BLUE CROSS/BLUE SHIELD | Admitting: Family Medicine

## 2016-10-29 VITALS — BP 126/82 | HR 99 | Ht 68.0 in | Wt 162.0 lb

## 2016-10-29 DIAGNOSIS — M222X1 Patellofemoral disorders, right knee: Secondary | ICD-10-CM | POA: Diagnosis not present

## 2016-10-29 DIAGNOSIS — M25511 Pain in right shoulder: Principal | ICD-10-CM

## 2016-10-29 DIAGNOSIS — G8929 Other chronic pain: Secondary | ICD-10-CM

## 2016-10-29 DIAGNOSIS — M7551 Bursitis of right shoulder: Secondary | ICD-10-CM | POA: Insufficient documentation

## 2016-10-29 MED ORDER — VITAMIN D (ERGOCALCIFEROL) 1.25 MG (50000 UNIT) PO CAPS: 50000.0000 [IU] | ORAL_CAPSULE | ORAL | 0 refills | Status: DC

## 2016-10-29 MED ORDER — GABAPENTIN 100 MG PO CAPS: 200.0000 mg | ORAL_CAPSULE | Freq: Every day | ORAL | 3 refills | Status: DC

## 2016-10-29 MED ORDER — DICLOFENAC SODIUM 2 % TD SOLN: 2.0000 "application " | Freq: Two times a day (BID) | TRANSDERMAL | 3 refills | Status: DC

## 2016-10-29 NOTE — Patient Instructions (Signed)
Good to see you.  Ice 20 minutes 2 times daily. Usually after activity and before bed. Gabapentin 200mg  at night.  Tyr to hold on your sleep aid for first couple nights Once weekly vitamin D for 12 weeks.  pennsaid pinkie amount topically 2 times daily as needed.  Exercises 3 times a week.  Try to keep hands within peripheral vision.  Over the counter get tramadol 500mg  twice daily  See me again in 4 weeks and we can discuss your knee.

## 2016-10-29 NOTE — Assessment & Plan Note (Signed)
Patient given injection today. Patient work with Product/process development scientist to learn home exercises in greater detail. Gabapentin given in case there is any cervical radiculopathy. If continuing have trouble we'll consider further x-rays. We discussed icing regimen. Topical anti-inflammatories also prescribed.

## 2016-10-29 NOTE — Assessment & Plan Note (Signed)
Patellofemoral syndrome. Home exercises given, continue no problems with consider injections, formal physical therapy and bracing.

## 2016-10-30 ENCOUNTER — Other Ambulatory Visit: Payer: Self-pay | Admitting: Family Medicine

## 2016-10-30 NOTE — Telephone Encounter (Signed)
Refill done.  

## 2016-11-05 ENCOUNTER — Encounter: Payer: Self-pay | Admitting: Gastroenterology

## 2016-11-05 ENCOUNTER — Ambulatory Visit (AMBULATORY_SURGERY_CENTER): Payer: Self-pay

## 2016-11-05 VITALS — Ht 68.25 in | Wt 159.2 lb

## 2016-11-05 DIAGNOSIS — Z860100 Personal history of colon polyps, unspecified: Secondary | ICD-10-CM

## 2016-11-05 MED ORDER — SUPREP BOWEL PREP KIT 17.5-3.13-1.6 GM/177ML PO SOLN: 1.0000 | Freq: Once | ORAL | 0 refills | Status: AC

## 2016-11-05 NOTE — Progress Notes (Signed)
No allergies to eggs or soy No diet meds No home oxygen No past problems with anesthesia  Registered emmi 

## 2016-11-19 ENCOUNTER — Encounter: Payer: Self-pay | Admitting: Gastroenterology

## 2016-11-19 ENCOUNTER — Ambulatory Visit (AMBULATORY_SURGERY_CENTER): Payer: BLUE CROSS/BLUE SHIELD | Admitting: Gastroenterology

## 2016-11-19 VITALS — BP 122/75 | HR 76 | Temp 97.7°F | Resp 10 | Ht 68.0 in | Wt 162.0 lb

## 2016-11-19 DIAGNOSIS — D122 Benign neoplasm of ascending colon: Secondary | ICD-10-CM

## 2016-11-19 DIAGNOSIS — Z86010 Personal history of colonic polyps: Secondary | ICD-10-CM | POA: Diagnosis present

## 2016-11-19 LAB — HM COLONOSCOPY

## 2016-11-19 MED ORDER — SODIUM CHLORIDE 0.9 % IV SOLN: 500.0000 mL | INTRAVENOUS | Status: DC

## 2016-11-19 NOTE — Progress Notes (Signed)
To recovery, report to RN, VSS. 

## 2016-11-19 NOTE — Progress Notes (Signed)
Pt's states no medical or surgical changes since previsit or office visit. 

## 2016-11-19 NOTE — Op Note (Signed)
[facility] Patient Name: Colton Andersen Procedure Date: 11/19/2016 9:09 AM MRN: [id number] Endoscopist: Mallie Mussel L. Loletha Carrow , MD Age: 56 Referring MD:  Date of Birth: 04-03-1960 Gender: Male Account #: [id number] Procedure:                Colonoscopy Indications:              Surveillance: Personal history of adenomatous                            polyps on last colonoscopy 5 years ago (76mm TA                            07/2011) Medicines:                Monitored Anesthesia Care Procedure:                Pre-Anesthesia Assessment:                           - Prior to the procedure, a History and Physical                            was performed, and patient medications and                            allergies were reviewed. The patient's tolerance of                            previous anesthesia was also reviewed. The risks                            and benefits of the procedure and the sedation                            options and risks were discussed with the patient.                            All questions were answered, and informed consent                            was obtained. Prior Anticoagulants: The patient has                            taken no previous anticoagulant or antiplatelet                            agents. ASA Grade Assessment: II - A patient with                            mild systemic disease. After reviewing the risks                            and benefits, the patient was deemed in  satisfactory condition to undergo the procedure.                           After obtaining informed consent, the colonoscope                            was passed under direct vision. Throughout the                            procedure, the patient's blood pressure, pulse, and                            oxygen saturations were monitored continuously. The                            Colonoscope was introduced through the anus and                  advanced to the the cecum, identified by                            appendiceal orifice and ileocecal valve. The                            colonoscopy was performed without difficulty. The                            patient tolerated the procedure well. The quality                            of the bowel preparation was good. The ileocecal                            valve, appendiceal orifice, and rectum were                            photographed. The quality of the bowel preparation                            was evaluated using the BBPS [facility] Bowel                            Preparation Scale) with scores of: Right Colon = 2,                            Transverse Colon = 2 and Left Colon = 2. The total                            BBPS score equals 6. The bowel preparation used was                            SUPREP. Scope In: 9:14:20 AM Scope Out: 9:29:10 AM Scope Withdrawal Time: 0 hours 12 minutes 37 seconds  Total Procedure Duration: 0 hours 14 minutes 50 seconds  Findings:  The perianal and digital rectal examinations were                            normal.                           A 4 mm polyp was found in the proximal ascending                            colon. The polyp was sessile. The polyp was removed                            with a cold snare. Resection and retrieval were                            complete.                           Diverticula were found in the left colon.                           The exam was otherwise without abnormality on                            direct and retroflexion views. Complications:            No immediate complications. Estimated Blood Loss:     Estimated blood loss was minimal. Impression:               - One 4 mm polyp in the proximal ascending colon,                            removed with a cold snare. Resected and retrieved.                           - Diverticulosis in the left colon.                            - The examination was otherwise normal on direct                            and retroflexion views. Recommendation:           - Patient has a contact number available for                            emergencies. The signs and symptoms of potential                            delayed complications were discussed with the                            patient. Return to normal activities tomorrow.                            Written discharge instructions  were provided to the                            patient.                           - Resume previous diet.                           - Continue present medications.                           - Await pathology results.                           - Repeat colonoscopy is recommended for                            surveillance. The colonoscopy date will be                            determined after pathology results from today's                            exam become available for review.  L. Loletha Carrow, MD 11/19/2016 9:34:27 AM This report has been signed electronically.

## 2016-11-19 NOTE — Progress Notes (Signed)
No problems noted in the recovery room. maw 

## 2016-11-19 NOTE — Patient Instructions (Signed)
YOU HAD AN ENDOSCOPIC PROCEDURE TODAY AT THE [facility]:   Refer to the procedure report that was given to you for any specific questions about what was found during the examination.  If the procedure report does not answer your questions, please call your gastroenterologist to clarify.  If you requested that your care partner not be given the details of your procedure findings, then the procedure report has been included in a sealed envelope for you to review at your convenience later.  YOU SHOULD EXPECT: Some feelings of bloating in the abdomen. Passage of more gas than usual.  Walking can help get rid of the air that was put into your GI tract during the procedure and reduce the bloating. If you had a lower endoscopy (such as a colonoscopy or flexible sigmoidoscopy) you may notice spotting of blood in your stool or on the toilet paper. If you underwent a bowel prep for your procedure, you may not have a normal bowel movement for a few days.  Please Note:  You might notice some irritation and congestion in your nose or some drainage.  This is from the oxygen used during your procedure.  There is no need for concern and it should clear up in a day or so.  SYMPTOMS TO REPORT IMMEDIATELY:   Following lower endoscopy (colonoscopy or flexible sigmoidoscopy):  Excessive amounts of blood in the stool  Significant tenderness or worsening of abdominal pains  Swelling of the abdomen that is new, acute  Fever of 100F or higher    For urgent or emergent issues, a gastroenterologist can be reached at any hour by calling [phone].   DIET:  We do recommend a small meal at first, but then you may proceed to your regular diet.  Drink plenty of fluids but you should avoid alcoholic beverages for 24 hours.  ACTIVITY:  You should plan to take it easy for the rest of today and you should NOT DRIVE or use heavy machinery until tomorrow (because of the sedation medicines used during the test).     FOLLOW UP: Our staff will call the number listed on your records the next business day following your procedure to check on you and address any questions or concerns that you may have regarding the information given to you following your procedure. If we do not reach you, we will leave a message.  However, if you are feeling well and you are not experiencing any problems, there is no need to return our call.  We will assume that you have returned to your regular daily activities without incident.  If any biopsies were taken you will be contacted by phone or by letter within the next 1-3 weeks.  Please call us at [phone] if you have not heard about the biopsies in 3 weeks.    SIGNATURES/CONFIDENTIALITY: You and/or your care partner have signed paperwork which will be entered into your electronic medical record.  These signatures attest to the fact that that the information above on your After Visit Summary has been reviewed and is understood.  Full responsibility of the confidentiality of this discharge information lies with you and/or your care-partner.    Handouts were given to your care partner on polyps and diverticulosis. You may resume your current medications today. Await biopsy results. Please call if any questions or concerns.   

## 2016-11-19 NOTE — Progress Notes (Signed)
Called to room to assist during endoscopic procedure.  Patient ID and intended procedure confirmed with present staff. Received instructions for my participation in the procedure from the performing physician.  

## 2016-11-20 ENCOUNTER — Telehealth: Payer: Self-pay

## 2016-11-20 NOTE — Telephone Encounter (Signed)
  Follow up Call-  Call back number 11/19/2016  Post procedure Call Back phone  # [phone]  Permission to leave phone message Yes  Some recent data might be hidden     Patient questions:  Do you have a fever, pain , or abdominal swelling? No. Pain Score  0 *  Have you tolerated food without any problems? Yes.    Have you been able to return to your normal activities? Yes.    Do you have any questions about your discharge instructions: Diet   No. Medications  No. Follow up visit  No.  Do you have questions or concerns about your Care? No.  Actions: * If pain score is 4 or above: No action needed, pain <4.

## 2016-11-22 ENCOUNTER — Encounter: Payer: Self-pay | Admitting: Gastroenterology
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
## 2016-11-27 ENCOUNTER — Encounter: Payer: Self-pay | Admitting: Family Medicine

## 2016-11-27 ENCOUNTER — Ambulatory Visit (INDEPENDENT_AMBULATORY_CARE_PROVIDER_SITE_OTHER): Payer: BLUE CROSS/BLUE SHIELD | Admitting: Family Medicine

## 2016-11-27 DIAGNOSIS — M7551 Bursitis of right shoulder: Secondary | ICD-10-CM | POA: Diagnosis not present

## 2016-11-27 DIAGNOSIS — M222X1 Patellofemoral disorders, right knee: Secondary | ICD-10-CM

## 2016-11-27 DIAGNOSIS — G5621 Lesion of ulnar nerve, right upper limb: Secondary | ICD-10-CM

## 2016-11-27 MED ORDER — GABAPENTIN 300 MG PO CAPS: 300.0000 mg | ORAL_CAPSULE | Freq: Every day | ORAL | 3 refills | Status: DC

## 2016-11-27 NOTE — Assessment & Plan Note (Signed)
Stable.       - Continue to monitor

## 2016-11-27 NOTE — Assessment & Plan Note (Signed)
Patient does have some mild neuropathy or subluxation. Discussed padding, topical pennsaid. Increase gabapentin to 300mg   RTC in 4 weeks if not resolved.

## 2016-11-27 NOTE — Patient Instructions (Signed)
Good to see you  I am glad shoulder is doing well Gabapentin 300mg  at night B12 1024mcg and B6 200mg  daily can help the nerve Avoid impact of the elbows so use a pillow or something.  pennsaid to elbows 2 times a day can help See me again in 4-6 weeks if not resolved.

## 2016-11-27 NOTE — Assessment & Plan Note (Signed)
Nearly resolved with some mild impingement still remaining. Discussed with patient icing regimen, home exercises, which activities doing which ones to avoid. His lungs patient does well we'll follow-up with me as needed

## 2016-12-03 ENCOUNTER — Ambulatory Visit: Payer: BLUE CROSS/BLUE SHIELD

## 2016-12-11 ENCOUNTER — Other Ambulatory Visit: Payer: Self-pay | Admitting: Internal Medicine

## 2016-12-11 DIAGNOSIS — M25571 Pain in right ankle and joints of right foot: Secondary | ICD-10-CM

## 2016-12-19 DIAGNOSIS — F3163 Bipolar disorder, current episode mixed, severe, without psychotic features: Secondary | ICD-10-CM | POA: Diagnosis not present

## 2017-01-03 ENCOUNTER — Telehealth: Payer: Self-pay | Admitting: Family Medicine

## 2017-01-03 DIAGNOSIS — M7551 Bursitis of right shoulder: Secondary | ICD-10-CM

## 2017-01-03 NOTE — Telephone Encounter (Signed)
Pt called requesting a referral for orthopiedic surgery due to shoulder pain. He said that this has been an ongoing issue and would like to go ahead and have surgery to get everything cleaned up. He would like this done as soon as possible. Please advise.

## 2017-01-03 NOTE — Telephone Encounter (Signed)
Discussed with pt. Referral entered for Dr. Mardelle Matte. Advised pt we will send over his OV notes & Dr. Luanna Cole office will contact him to schedule an appt. Pt understood.

## 2017-01-04 ENCOUNTER — Other Ambulatory Visit: Payer: Self-pay | Admitting: Internal Medicine

## 2017-01-04 NOTE — Telephone Encounter (Signed)
[place] controlled substance database checked.  Ok to fill medication. printed 

## 2017-01-04 NOTE — Telephone Encounter (Signed)
This has been faxed.

## 2017-01-09 DIAGNOSIS — M25511 Pain in right shoulder: Secondary | ICD-10-CM | POA: Diagnosis not present

## 2017-01-13 DIAGNOSIS — M25511 Pain in right shoulder: Secondary | ICD-10-CM | POA: Diagnosis not present

## 2017-01-18 ENCOUNTER — Other Ambulatory Visit: Payer: Self-pay | Admitting: Family Medicine

## 2017-01-23 DIAGNOSIS — M25511 Pain in right shoulder: Secondary | ICD-10-CM | POA: Diagnosis not present

## 2017-02-07 DIAGNOSIS — G8918 Other acute postprocedural pain: Secondary | ICD-10-CM | POA: Diagnosis not present

## 2017-02-07 DIAGNOSIS — M948X1 Other specified disorders of cartilage, shoulder: Secondary | ICD-10-CM | POA: Diagnosis not present

## 2017-02-07 DIAGNOSIS — M75111 Incomplete rotator cuff tear or rupture of right shoulder, not specified as traumatic: Secondary | ICD-10-CM | POA: Diagnosis not present

## 2017-02-07 DIAGNOSIS — M94211 Chondromalacia, right shoulder: Secondary | ICD-10-CM | POA: Diagnosis not present

## 2017-02-07 DIAGNOSIS — M7541 Impingement syndrome of right shoulder: Secondary | ICD-10-CM | POA: Diagnosis not present

## 2017-02-07 DIAGNOSIS — M7501 Adhesive capsulitis of right shoulder: Secondary | ICD-10-CM | POA: Diagnosis not present

## 2017-02-07 DIAGNOSIS — M24111 Other articular cartilage disorders, right shoulder: Secondary | ICD-10-CM | POA: Diagnosis not present

## 2017-02-20 DIAGNOSIS — M25511 Pain in right shoulder: Secondary | ICD-10-CM | POA: Diagnosis not present

## 2017-03-20 DIAGNOSIS — M25511 Pain in right shoulder: Secondary | ICD-10-CM | POA: Diagnosis not present

## 2017-04-11 ENCOUNTER — Other Ambulatory Visit: Payer: Self-pay | Admitting: Family Medicine

## 2017-04-15 NOTE — Telephone Encounter (Signed)
Refill denied. Pt has completed course of treatment.  

## 2017-04-19 DIAGNOSIS — M9903 Segmental and somatic dysfunction of lumbar region: Secondary | ICD-10-CM | POA: Diagnosis not present

## 2017-04-19 DIAGNOSIS — M62830 Muscle spasm of back: Secondary | ICD-10-CM | POA: Diagnosis not present

## 2017-04-19 DIAGNOSIS — M5441 Lumbago with sciatica, right side: Secondary | ICD-10-CM | POA: Diagnosis not present

## 2017-04-19 DIAGNOSIS — M5136 Other intervertebral disc degeneration, lumbar region: Secondary | ICD-10-CM | POA: Diagnosis not present

## 2017-07-30 DIAGNOSIS — F3163 Bipolar disorder, current episode mixed, severe, without psychotic features: Secondary | ICD-10-CM | POA: Diagnosis not present

## 2017-10-21 ENCOUNTER — Encounter: Payer: Self-pay | Admitting: Internal Medicine

## 2017-10-21 ENCOUNTER — Ambulatory Visit: Payer: BLUE CROSS/BLUE SHIELD | Admitting: Internal Medicine

## 2017-10-21 VITALS — BP 140/90 | HR 82 | Temp 98.0°F | Resp 16 | Ht 68.0 in | Wt 159.0 lb

## 2017-10-21 DIAGNOSIS — R3129 Other microscopic hematuria: Secondary | ICD-10-CM | POA: Diagnosis not present

## 2017-10-21 DIAGNOSIS — N451 Epididymitis: Secondary | ICD-10-CM | POA: Diagnosis not present

## 2017-10-21 MED ORDER — DOXYCYCLINE MONOHYDRATE 100 MG PO TABS: 100.0000 mg | ORAL_TABLET | Freq: Two times a day (BID) | ORAL | 0 refills | Status: AC

## 2017-10-21 NOTE — Patient Instructions (Signed)
Epididymitis Epididymitis is swelling (inflammation) of the epididymis. The epididymis is a cord-like structure that is located along the top and back part of the testicle. It collects and stores sperm from the testicle. This condition can also cause pain and swelling of the testicle and scrotum. Symptoms usually start suddenly (acute epididymitis). Sometimes epididymitis starts gradually and lasts for a while (chronic epididymitis). This type may be harder to treat. What are the causes? In men 35 and younger, this condition is usually caused by a bacterial infection or sexually transmitted disease (STD), such as:  Gonorrhea.  Chlamydia.  In men 35 and older who do not have anal sex, this condition is usually caused by bacteria from a blockage or abnormalities in the urinary system. These can result from:  Having a tube placed into the bladder (urinary catheter).  Having an enlarged or inflamed prostate gland.  Having recent urinary tract surgery.  In men who have a condition that weakens the body's defense system (immune system), such as HIV, this condition can be caused by:  Other bacteria, including tuberculosis and syphilis.  Viruses.  Fungi.  Sometimes this condition occurs without infection. That may happen if urine flows backward into the epididymis after heavy lifting or straining. What increases the risk? This condition is more likely to develop in men:  Who have unprotected sex with more than one partner.  Who have anal sex.  Who have recently had surgery.  Who have a urinary catheter.  Who have urinary problems.  Who have a suppressed immune system.  What are the signs or symptoms? This condition usually begins suddenly with chills, fever, and pain behind the scrotum and in the testicle. Other symptoms include:  Swelling of the scrotum, testicle, or both.  Pain whenejaculatingor urinating.  Pain in the back or belly.  Nausea.  Itching and discharge  from the penis.  Frequent need to pass urine.  Redness and tenderness of the scrotum.  How is this diagnosed? Your health care provider can diagnose this condition based on your symptoms and medical history. Your health care provider will also do a physical exam to ask about your symptoms and check your scrotum and testicle for swelling, pain, and redness. You may also have other tests, including:  Examination of discharge from the penis.  Urine tests for infections, such as STDs.  Your health care provider may test you for other STDs, including HIV. How is this treated? Treatment for this condition depends on the cause. If your condition is caused by a bacterial infection, oral antibiotic medicine may be prescribed. If the bacterial infection has spread to your blood, you may need to receive IV antibiotics. Nonbacterial epididymitis is treated with home care that includes bed rest and elevation of the scrotum. Surgery may be needed to treat:  Bacterial epididymitis that causes pus to build up in the scrotum (abscess).  Chronic epididymitis that has not responded to other treatments.  Follow these instructions at home: Medicines  Take over-the-counter and prescription medicines only as told by your health care provider.  If you were prescribed an antibiotic medicine, take it as told by your health care provider. Do not stop taking the antibiotic even if your condition improves. Sexual Activity  If your epididymitis was caused by an STD, avoid sexual activity until your treatment is complete.  Inform your sexual partner or partners if you test positive for an STD. They may need to be treated.Do not engage in sexual activity with your partner or   partners until their treatment is completed. General instructions  Return to your normal activities as told by your health care provider. Ask your health care provider what activities are safe for you.  Keep your scrotum elevated and  supported while resting. Ask your health care provider if you should wear a scrotal support, such as a jockstrap. Wear it as told by your health care provider.  If directed, apply ice to the affected area: ? Put ice in a plastic bag. ? Place a towel between your skin and the bag. ? Leave the ice on for 20 minutes, 2-3 times per day.  Try taking a sitz bath to help with discomfort. This is a warm water bath that is taken while you are sitting down. The water should only come up to your hips and should cover your buttocks. Do this 3-4 times per day or as told by your health care provider.  Keep all follow-up visits as told by your health care provider. This is important. Contact a health care provider if:  You have a fever.  Your pain medicine is not helping.  Your pain is getting worse.  Your symptoms do not improve within three days. This information is not intended to replace advice given to you by your health care provider. Make sure you discuss any questions you have with your health care provider. Document Released: 03/09/2000 Document Revised: 08/18/2015 Document Reviewed: 07/28/2014 Elsevier Interactive Patient Education  2018 [employer]  

## 2017-10-21 NOTE — Progress Notes (Signed)
Subjective:  Patient ID: Colton Andersen, male    DOB: 05-Mar-1961  Age: 57 y.o. MRN: [id number]  CC: Testicle Pain   HPI Colton Andersen presents for a one-week history of swelling and discomfort in his right scrotum.  Outpatient Medications Prior to Visit  Medication Sig Dispense Refill  . cyanocobalamin 2000 MCG tablet Take 1 tablet (2,000 mcg total) by mouth daily. 90 tablet 3  . diazepam (VALIUM) 10 MG tablet Take 10 mg by mouth.    . gabapentin (NEURONTIN) 300 MG capsule Take 1 capsule (300 mg total) by mouth at bedtime. 30 capsule 3  . lamoTRIgine (LAMICTAL) 200 MG tablet Take 200 mg by mouth daily.    . QUEtiapine (SEROQUEL) 400 MG tablet TK 1 AND 1/2 TS PO QHS  4  . traMADol (ULTRAM) 50 MG tablet TAKE 1 TABLET BY MOUTH EVERY 8 HOURS AS NEEDED FOR PAIN 50 tablet 0  . Vitamin D, Ergocalciferol, (DRISDOL) 50000 units CAPS capsule TAKE 1 CAPSULE BY MOUTH EVERY 7 DAYS 12 capsule 0   Facility-Administered Medications Prior to Visit  Medication Dose Route Frequency Provider Last Rate Last Dose  . 0.9 %  sodium chloride infusion  500 mL Intravenous Continuous Danis, Estill Cotta III, MD        ROS Review of Systems  Constitutional: Negative for chills, fatigue and fever.  HENT: Negative.   Eyes: Negative for visual disturbance.  Respiratory: Negative for cough, chest tightness, shortness of breath and wheezing.   Gastrointestinal: Negative for abdominal pain, diarrhea, nausea and vomiting.  Genitourinary: Positive for scrotal swelling and testicular pain. Negative for decreased urine volume, difficulty urinating, discharge, dysuria, flank pain, frequency, genital sores, hematuria, penile pain, penile swelling and urgency.  Musculoskeletal: Negative.   Skin: Negative.  Negative for rash.  Neurological: Negative.   Hematological: Negative for adenopathy. Does not bruise/bleed easily.  Psychiatric/Behavioral: Negative.     Objective:  BP 140/90 (BP Location: Left Arm, Patient  Position: Sitting, Cuff Size: Normal)   Pulse 82   Temp 98 F (36.7 C) (Oral)   Resp 16   Ht 5\' 8"  (1.727 m)   Wt 159 lb (72.1 kg)   SpO2 96%   BMI 24.18 kg/m   BP Readings from Last 3 Encounters:  10/21/17 140/90  11/27/16 122/82  11/19/16 122/75    Wt Readings from Last 3 Encounters:  10/21/17 159 lb (72.1 kg)  11/27/16 158 lb (71.7 kg)  11/19/16 162 lb (73.5 kg)    Physical Exam  Constitutional: He is oriented to person, place, and time. No distress.  HENT:  Mouth/Throat: Oropharynx is clear and moist. No oropharyngeal exudate.  Eyes: Conjunctivae are normal. No scleral icterus.  Neck: Normal range of motion. Neck supple. No JVD present. No thyromegaly present.  Cardiovascular: Normal rate, regular rhythm and normal heart sounds. Exam reveals no gallop.  No murmur heard. Pulmonary/Chest: Effort normal and breath sounds normal. He has no wheezes. He has no rales.  Abdominal: Soft. Bowel sounds are normal. He exhibits no mass. There is no hepatosplenomegaly. There is no tenderness. No hernia. Hernia confirmed negative in the right inguinal area and confirmed negative in the left inguinal area.  Genitourinary: Penis normal. Right testis shows swelling and tenderness. Right testis shows no mass. Left testis shows no mass, no swelling and no tenderness. Circumcised. No penile erythema or penile tenderness. No discharge found.  Genitourinary Comments: In the left hemiscrotum there is a small cyst in the left epididymis.  In  the right hemiscrotum the right epididymis is mildly enlarged and tender.  Musculoskeletal: Normal range of motion. He exhibits no edema.  Lymphadenopathy:    He has no cervical adenopathy. No inguinal adenopathy noted on the right or left side.  Neurological: He is alert and oriented to person, place, and time.  Skin: Skin is warm and dry. No rash noted. He is not diaphoretic.  Vitals reviewed.   Lab Results  Component Value Date   WBC 8.2 10/02/2016     HGB 13.9 10/02/2016   HCT 40.2 10/02/2016   PLT 234.0 10/02/2016   GLUCOSE 103 (H) 08/06/2016   CHOL 218 (H) 02/01/2016   TRIG 93.0 02/01/2016   HDL 52.20 02/01/2016   LDLDIRECT 153.2 11/08/2008   LDLCALC 148 (H) 02/01/2016   ALT 24 08/06/2016   AST 25 08/06/2016   NA 140 08/06/2016   K 5.0 08/06/2016   CL 104 08/06/2016   CREATININE 1.19 08/06/2016   BUN 18 08/06/2016   CO2 27 08/06/2016   TSH 0.78 02/01/2016   PSA 2.96 02/01/2016   INR 1.0 03/29/2014   HGBA1C 5.5 06/28/2008    Mr Foot Right Wo Contrast  Result Date: 07/07/2015 CLINICAL DATA:  Pain in the ball of the foot for 3 months. No known injury or prior relevant surgery. History of neuromuscular disorder. EXAM: MRI OF THE RIGHT FOREFOOT WITHOUT CONTRAST TECHNIQUE: Multiplanar, multisequence MR imaging was performed. No intravenous contrast was administered. COMPARISON:  Radiographs 06/23/2015 FINDINGS: A capsule was placed over the area of pain, along the plantar aspect of the second web space. There is mild heterogeneity and lobularity of the subcutaneous fat both medial and lateral to the marker. No focal fluid collection, mass or inflammatory changes seen. There is minimal fluid within the flexor tendon of the second digit, within physiologic limits. There are minimal degenerative changes of the first metatarsal phalangeal joint associated with a small effusion. No erosive changes are seen. The additional metatarsal phalangeal and interphalangeal joints appear normal. The alignment is normal at the Lisfranc joint. There is no evidence of acute fracture or dislocation. IMPRESSION: 1. No acute findings or clear explanation for the patient's symptoms. No evidence of Morton's neuroma on noncontrast imaging. 2. Mild nonspecific lobularity of the subcutaneous fat along the ball of the foot. 3. Minimal first MTP degenerative changes. Electronically Signed   By: Richardean Sale M.D.   On: 07/07/2015 09:11    Assessment & Plan:    Colton Andersen was seen today for testicle pain.  Diagnoses and all orders for this visit:  Hematuria, microscopic- I will recheck his UA to see if this has cleared. -     Urinalysis, Routine w reflex microscopic; Future  Epididymitis, right- He will wear tight supportive underwear and use ibuprofen as needed for any discomfort.  Will treat the infection with doxycycline.  Will check a UA to see if there is any evidence of urethritis or prostatitis. -     doxycycline (ADOXA) 100 MG tablet; Take 1 tablet (100 mg total) by mouth 2 (two) times daily for 10 days.   I am having Colton Andersen start on doxycycline. I am also having him maintain his lamoTRIgine, diazepam, QUEtiapine, cyanocobalamin, gabapentin, traMADol, and Vitamin D (Ergocalciferol). We will continue to administer sodium chloride.  Meds ordered this encounter  Medications  . doxycycline (ADOXA) 100 MG tablet    Sig: Take 1 tablet (100 mg total) by mouth 2 (two) times daily for 10 days.    Dispense:  20 tablet    Refill:  0     Follow-up: Return in about 3 weeks (around 11/11/2017).  Scarlette Calico, MD

## 2017-11-05 ENCOUNTER — Other Ambulatory Visit: Payer: Self-pay | Admitting: Internal Medicine

## 2017-11-05 ENCOUNTER — Ambulatory Visit: Payer: Self-pay | Admitting: Internal Medicine

## 2017-11-05 ENCOUNTER — Encounter: Payer: Self-pay | Admitting: Internal Medicine

## 2017-11-05 ENCOUNTER — Other Ambulatory Visit (INDEPENDENT_AMBULATORY_CARE_PROVIDER_SITE_OTHER): Payer: BLUE CROSS/BLUE SHIELD

## 2017-11-05 ENCOUNTER — Ambulatory Visit
Admission: RE | Admit: 2017-11-05 | Discharge: 2017-11-05 | Disposition: A | Discharge status: Home / Self Care | Payer: BLUE CROSS/BLUE SHIELD | Source: Ambulatory Visit | Attending: Internal Medicine | Admitting: Internal Medicine

## 2017-11-05 ENCOUNTER — Ambulatory Visit: Payer: BLUE CROSS/BLUE SHIELD | Admitting: Internal Medicine

## 2017-11-05 VITALS — BP 140/78 | HR 70 | Temp 98.2°F | Resp 16 | Ht 68.0 in | Wt 158.0 lb

## 2017-11-05 DIAGNOSIS — N451 Epididymitis: Secondary | ICD-10-CM

## 2017-11-05 DIAGNOSIS — R3129 Other microscopic hematuria: Secondary | ICD-10-CM

## 2017-11-05 LAB — URINALYSIS, ROUTINE W REFLEX MICROSCOPIC
Bilirubin Urine: NEGATIVE
Ketones, ur: NEGATIVE
LEUKOCYTES UA: NEGATIVE
NITRITE: NEGATIVE
Specific Gravity, Urine: 1.015 (ref 1.000–1.030)
TOTAL PROTEIN, URINE-UPE24: NEGATIVE
Urine Glucose: NEGATIVE
Urobilinogen, UA: 0.2 (ref 0.0–1.0)
pH: 6 (ref 5.0–8.0)

## 2017-11-05 MED ORDER — LEVOFLOXACIN 500 MG PO TABS: 500.0000 mg | ORAL_TABLET | Freq: Every day | ORAL | 0 refills | Status: AC

## 2017-11-05 MED ORDER — DICLOFENAC 18 MG PO CAPS: 1.0000 | ORAL_CAPSULE | Freq: Three times a day (TID) | ORAL | 0 refills | Status: DC | PRN

## 2017-11-05 NOTE — Patient Instructions (Signed)
Epididymitis Epididymitis is swelling (inflammation) of the epididymis. The epididymis is a cord-like structure that is located along the top and back part of the testicle. It collects and stores sperm from the testicle. This condition can also cause pain and swelling of the testicle and scrotum. Symptoms usually start suddenly (acute epididymitis). Sometimes epididymitis starts gradually and lasts for a while (chronic epididymitis). This type may be harder to treat. What are the causes? In men 35 and younger, this condition is usually caused by a bacterial infection or sexually transmitted disease (STD), such as:  Gonorrhea.  Chlamydia.  In men 35 and older who do not have anal sex, this condition is usually caused by bacteria from a blockage or abnormalities in the urinary system. These can result from:  Having a tube placed into the bladder (urinary catheter).  Having an enlarged or inflamed prostate gland.  Having recent urinary tract surgery.  In men who have a condition that weakens the body's defense system (immune system), such as HIV, this condition can be caused by:  Other bacteria, including tuberculosis and syphilis.  Viruses.  Fungi.  Sometimes this condition occurs without infection. That may happen if urine flows backward into the epididymis after heavy lifting or straining. What increases the risk? This condition is more likely to develop in men:  Who have unprotected sex with more than one partner.  Who have anal sex.  Who have recently had surgery.  Who have a urinary catheter.  Who have urinary problems.  Who have a suppressed immune system.  What are the signs or symptoms? This condition usually begins suddenly with chills, fever, and pain behind the scrotum and in the testicle. Other symptoms include:  Swelling of the scrotum, testicle, or both.  Pain whenejaculatingor urinating.  Pain in the back or belly.  Nausea.  Itching and discharge  from the penis.  Frequent need to pass urine.  Redness and tenderness of the scrotum.  How is this diagnosed? Your health care provider can diagnose this condition based on your symptoms and medical history. Your health care provider will also do a physical exam to ask about your symptoms and check your scrotum and testicle for swelling, pain, and redness. You may also have other tests, including:  Examination of discharge from the penis.  Urine tests for infections, such as STDs.  Your health care provider may test you for other STDs, including HIV. How is this treated? Treatment for this condition depends on the cause. If your condition is caused by a bacterial infection, oral antibiotic medicine may be prescribed. If the bacterial infection has spread to your blood, you may need to receive IV antibiotics. Nonbacterial epididymitis is treated with home care that includes bed rest and elevation of the scrotum. Surgery may be needed to treat:  Bacterial epididymitis that causes pus to build up in the scrotum (abscess).  Chronic epididymitis that has not responded to other treatments.  Follow these instructions at home: Medicines  Take over-the-counter and prescription medicines only as told by your health care provider.  If you were prescribed an antibiotic medicine, take it as told by your health care provider. Do not stop taking the antibiotic even if your condition improves. Sexual Activity  If your epididymitis was caused by an STD, avoid sexual activity until your treatment is complete.  Inform your sexual partner or partners if you test positive for an STD. They may need to be treated.Do not engage in sexual activity with your partner or   partners until their treatment is completed. General instructions  Return to your normal activities as told by your health care provider. Ask your health care provider what activities are safe for you.  Keep your scrotum elevated and  supported while resting. Ask your health care provider if you should wear a scrotal support, such as a jockstrap. Wear it as told by your health care provider.  If directed, apply ice to the affected area: ? Put ice in a plastic bag. ? Place a towel between your skin and the bag. ? Leave the ice on for 20 minutes, 2-3 times per day.  Try taking a sitz bath to help with discomfort. This is a warm water bath that is taken while you are sitting down. The water should only come up to your hips and should cover your buttocks. Do this 3-4 times per day or as told by your health care provider.  Keep all follow-up visits as told by your health care provider. This is important. Contact a health care provider if:  You have a fever.  Your pain medicine is not helping.  Your pain is getting worse.  Your symptoms do not improve within three days. This information is not intended to replace advice given to you by your health care provider. Make sure you discuss any questions you have with your health care provider. Document Released: 03/09/2000 Document Revised: 08/18/2015 Document Reviewed: 07/28/2014 Elsevier Interactive Patient Education  2018 [employer]  

## 2017-11-05 NOTE — Progress Notes (Signed)
Subjective:  Patient ID: Colton Andersen, male    DOB: 11-25-1960  Age: 57 y.o. MRN: [id number]  CC: Testicle Pain   HPI Colton Andersen presents for f/up - I saw him about 2 weeks ago for discomfort in his right scrotum.  He was diagnosed with epididymitis and treated with a 10-day course of doxycycline.  He tells me the symptoms have not improved much.  He is wearing tight supportive underwear but is not taking any thing for the pain.  Outpatient Medications Prior to Visit  Medication Sig Dispense Refill  . diazepam (VALIUM) 10 MG tablet Take 10 mg by mouth.    . lamoTRIgine (LAMICTAL) 200 MG tablet Take 200 mg by mouth daily.    . QUEtiapine (SEROQUEL) 400 MG tablet TK 1 AND 1/2 TS PO QHS  4  . cyanocobalamin 2000 MCG tablet Take 1 tablet (2,000 mcg total) by mouth daily. 90 tablet 3  . gabapentin (NEURONTIN) 300 MG capsule Take 1 capsule (300 mg total) by mouth at bedtime. 30 capsule 3  . traMADol (ULTRAM) 50 MG tablet TAKE 1 TABLET BY MOUTH EVERY 8 HOURS AS NEEDED FOR PAIN 50 tablet 0  . Vitamin D, Ergocalciferol, (DRISDOL) 50000 units CAPS capsule TAKE 1 CAPSULE BY MOUTH EVERY 7 DAYS 12 capsule 0   Facility-Administered Medications Prior to Visit  Medication Dose Route Frequency Provider Last Rate Last Dose  . 0.9 %  sodium chloride infusion  500 mL Intravenous Continuous Danis, Estill Cotta III, MD        ROS Review of Systems  Constitutional: Negative.  Negative for chills, fatigue and fever.  HENT: Negative.   Eyes: Negative for visual disturbance.  Respiratory: Negative for cough, chest tightness, shortness of breath and wheezing.   Cardiovascular: Negative for chest pain, palpitations and leg swelling.  Gastrointestinal: Negative for abdominal pain, constipation, diarrhea, nausea and vomiting.  Endocrine: Negative.   Genitourinary: Positive for scrotal swelling and testicular pain. Negative for decreased urine volume, difficulty urinating, discharge, dysuria, flank pain,  frequency, genital sores, hematuria, penile pain, penile swelling and urgency.  Musculoskeletal: Negative.  Negative for arthralgias and myalgias.  Skin: Negative.  Negative for color change and rash.  Neurological: Negative.  Negative for dizziness, weakness and headaches.  Hematological: Negative for adenopathy. Does not bruise/bleed easily.  Psychiatric/Behavioral: Negative.     Objective:  BP 140/78 (BP Location: Left Arm, Patient Position: Sitting, Cuff Size: Normal)   Pulse 70   Temp 98.2 F (36.8 C) (Oral)   Resp 16   Ht 5\' 8"  (1.727 m)   Wt 158 lb (71.7 kg)   SpO2 97%   BMI 24.02 kg/m   BP Readings from Last 3 Encounters:  11/05/17 140/78  10/21/17 140/90  11/27/16 122/82    Wt Readings from Last 3 Encounters:  11/05/17 158 lb (71.7 kg)  10/21/17 159 lb (72.1 kg)  11/27/16 158 lb (71.7 kg)    Physical Exam  Constitutional: No distress.  HENT:  Mouth/Throat: Oropharynx is clear and moist. No oropharyngeal exudate.  Eyes: Conjunctivae are normal. No scleral icterus.  Neck: Normal range of motion. Neck supple. No thyromegaly present.  Cardiovascular: Normal rate, regular rhythm and normal heart sounds.  Pulmonary/Chest: Effort normal and breath sounds normal. No respiratory distress. He has no wheezes. He has no rales.  Abdominal: Soft. Normal appearance and bowel sounds are normal. He exhibits no mass. There is no hepatosplenomegaly. There is no tenderness. No hernia. Hernia confirmed negative in the right  inguinal area and confirmed negative in the left inguinal area.  Genitourinary: Penis normal. Right testis shows swelling and tenderness. Right testis shows no mass. Right testis is descended. Left testis shows no mass, no swelling and no tenderness. Left testis is descended. Circumcised. No penile erythema or penile tenderness. No discharge found.  Lymphadenopathy:    He has no cervical adenopathy. No inguinal adenopathy noted on the right or left side.  Skin: He  is not diaphoretic.  Vitals reviewed.   Lab Results  Component Value Date   WBC 8.2 10/02/2016   HGB 13.9 10/02/2016   HCT 40.2 10/02/2016   PLT 234.0 10/02/2016   GLUCOSE 103 (H) 08/06/2016   CHOL 218 (H) 02/01/2016   TRIG 93.0 02/01/2016   HDL 52.20 02/01/2016   LDLDIRECT 153.2 11/08/2008   LDLCALC 148 (H) 02/01/2016   ALT 24 08/06/2016   AST 25 08/06/2016   NA 140 08/06/2016   K 5.0 08/06/2016   CL 104 08/06/2016   CREATININE 1.19 08/06/2016   BUN 18 08/06/2016   CO2 27 08/06/2016   TSH 0.78 02/01/2016   PSA 2.96 02/01/2016   INR 1.0 03/29/2014   HGBA1C 5.5 06/28/2008    US Scrotum W/doppler  Result Date: 11/05/2017 CLINICAL DATA:  Right epididymitis, microscopic hematuria. EXAM: SCROTAL ULTRASOUND DOPPLER ULTRASOUND OF THE TESTICLES TECHNIQUE: Complete ultrasound examination of the testicles, epididymis, and other scrotal structures was performed. Color and spectral Doppler ultrasound were also utilized to evaluate blood flow to the testicles. COMPARISON:  None. FINDINGS: Right testicle Measurements: 4.8 x 3.6 x 2.2 cm. No mass or microlithiasis visualized. Left testicle Measurements: 4.2 x 3.0 x 1.8 cm. No mass or microlithiasis visualized. Right epididymis: Mildly enlarged with slightly increased flow on Doppler consistent with hyperemia. Left epididymis:  Normal in size and appearance. Hydrocele:  None visualized. Varicocele:  None visualized. Pulsed Doppler interrogation of both testes demonstrates normal low resistance arterial and venous waveforms bilaterally. IMPRESSION: Findings consistent with mild right-sided epididymitis. No evidence of testicular mass or torsion. Electronically Signed   By: Colton Andersen, M.D.   On: 11/05/2017 12:15    Assessment & Plan:   [name] was seen today for testicle pain.  Diagnoses and all orders for this visit:  Epididymitis, right-  The ultrasound today confirms mild epididymitis.  He has not responded well to the doxycycline and  he is allergic to sulfa and penicillins.  Will try a course of a  and will try an anti-inflammatory for relief of the discomfort. -     levofloxacin (LEVAQUIN) 500 MG tablet; Take 1 tablet (500 mg total) by mouth daily for 14 days. -     Diclofenac (ZORVOLEX) 18 MG CAPS; Take 1 capsule by mouth 3 (three) times daily with meals as needed.   I have discontinued Hinda Lenis. Amirault's cyanocobalamin, gabapentin, traMADol, and Vitamin D (Ergocalciferol). I am also having him start on levofloxacin and Diclofenac. Additionally, I am having him maintain his lamoTRIgine, diazepam, and QUEtiapine. We will continue to administer sodium chloride.  Meds ordered this encounter  Medications  . levofloxacin (LEVAQUIN) 500 MG tablet    Sig: Take 1 tablet (500 mg total) by mouth daily for 14 days.    Dispense:  14 tablet    Refill:  0  . Diclofenac (ZORVOLEX) 18 MG CAPS    Sig: Take 1 capsule by mouth 3 (three) times daily with meals as needed.    Dispense:  12 capsule    Refill:  0  Follow-up: Return in about 3 weeks (around 11/26/2017).  Scarlette Calico, MD

## 2017-11-05 NOTE — Telephone Encounter (Signed)
Patient was seen 2 weeks ago- was treated for swollen testicle with antibiotic- patient states the symptoms did not completely resolve and he is worse then originally seen and having abdominal pain. Call to office to report new symptoms- office is going to set up [place]- appointment with provider at 3:00. Patient is aware things may change due to [place] results. Best contact # [phone]- patient will await call.  Reason for Disposition . Scrotum is painful or tender to touch  Answer Assessment - Initial Assessment Questions 1. SCROTAL SWELLING: "What does the scrotum look like?" "How swollen is it?" (mild, moderate severe; compare to other side)     Not discolored- but contracted-  Triple size 2. LOCATION: "Where is the swelling located?"     Right testicle is swollen 3. ONSET: "When did the swelling start?"     3 weeks ago 4. PATTERN: "Does it come and go, or has it been constant since it started?"     constant 5. SCROTAL PAIN: "Is there any pain?" If so, ask: "How bad is it?"  (Scale 1-10; or mild, moderate, severe)     5- pain 6. HERNIA: "Has a doctor ever told you that you have a hernia?"     no 7. OTHER SYMPTOMS: "Do you have any other symptoms?" (e.g., fever, abdominal pain, vomiting, difficulty passing urine)     Abdominal pain, nausea, lethargic, trouble passing urine, low grade  Protocols used: SCROTUM SWELLING-A-AH

## 2017-12-16 ENCOUNTER — Ambulatory Visit (INDEPENDENT_AMBULATORY_CARE_PROVIDER_SITE_OTHER): Payer: BLUE CROSS/BLUE SHIELD | Admitting: Internal Medicine

## 2017-12-16 ENCOUNTER — Other Ambulatory Visit (INDEPENDENT_AMBULATORY_CARE_PROVIDER_SITE_OTHER): Payer: BLUE CROSS/BLUE SHIELD

## 2017-12-16 ENCOUNTER — Encounter: Payer: Self-pay | Admitting: Internal Medicine

## 2017-12-16 VITALS — BP 140/90 | HR 83 | Temp 98.0°F | Resp 16 | Ht 68.0 in | Wt 155.8 lb

## 2017-12-16 DIAGNOSIS — Z23 Encounter for immunization: Secondary | ICD-10-CM | POA: Diagnosis not present

## 2017-12-16 DIAGNOSIS — E785 Hyperlipidemia, unspecified: Secondary | ICD-10-CM

## 2017-12-16 DIAGNOSIS — Z0000 Encounter for general adult medical examination without abnormal findings: Secondary | ICD-10-CM | POA: Diagnosis not present

## 2017-12-16 DIAGNOSIS — K219 Gastro-esophageal reflux disease without esophagitis: Secondary | ICD-10-CM | POA: Diagnosis not present

## 2017-12-16 DIAGNOSIS — G63 Polyneuropathy in diseases classified elsewhere: Secondary | ICD-10-CM | POA: Diagnosis not present

## 2017-12-16 DIAGNOSIS — I10 Essential (primary) hypertension: Secondary | ICD-10-CM | POA: Insufficient documentation

## 2017-12-16 DIAGNOSIS — R3129 Other microscopic hematuria: Secondary | ICD-10-CM

## 2017-12-16 DIAGNOSIS — N451 Epididymitis: Secondary | ICD-10-CM

## 2017-12-16 DIAGNOSIS — E538 Deficiency of other specified B group vitamins: Secondary | ICD-10-CM

## 2017-12-16 DIAGNOSIS — B182 Chronic viral hepatitis C: Secondary | ICD-10-CM

## 2017-12-16 LAB — CBC WITH DIFFERENTIAL/PLATELET
Basophils Absolute: 0 10*3/uL (ref 0.0–0.1)
Basophils Relative: 0.7 % (ref 0.0–3.0)
EOS PCT: 2 % (ref 0.0–5.0)
Eosinophils Absolute: 0.1 10*3/uL (ref 0.0–0.7)
HCT: 43 % (ref 39.0–52.0)
Hemoglobin: 15 g/dL (ref 13.0–17.0)
LYMPHS ABS: 1.7 10*3/uL (ref 0.7–4.0)
Lymphocytes Relative: 29.6 % (ref 12.0–46.0)
MCHC: 34.8 g/dL (ref 30.0–36.0)
MCV: 87 fl (ref 78.0–100.0)
MONOS PCT: 7.7 % (ref 3.0–12.0)
Monocytes Absolute: 0.4 10*3/uL (ref 0.1–1.0)
NEUTROS ABS: 3.4 10*3/uL (ref 1.4–7.7)
NEUTROS PCT: 60 % (ref 43.0–77.0)
PLATELETS: 237 10*3/uL (ref 150.0–400.0)
RBC: 4.94 Mil/uL (ref 4.22–5.81)
RDW: 13.2 % (ref 11.5–15.5)
WBC: 5.6 10*3/uL (ref 4.0–10.5)

## 2017-12-16 LAB — FOLATE: Folate: 15.8 ng/mL (ref >5.9)

## 2017-12-16 LAB — URINALYSIS, ROUTINE W REFLEX MICROSCOPIC
Bilirubin Urine: NEGATIVE
HGB URINE DIPSTICK: NEGATIVE
KETONES UR: NEGATIVE
Leukocytes, UA: NEGATIVE
NITRITE: NEGATIVE
PH: 7 (ref 5.0–8.0)
Total Protein, Urine: NEGATIVE
Urine Glucose: NEGATIVE
Urobilinogen, UA: 0.2 (ref 0.0–1.0)

## 2017-12-16 LAB — COMPREHENSIVE METABOLIC PANEL
ALK PHOS: 23 U/L — AB (ref 39–117)
ALT: 14 U/L (ref 0–53)
AST: 16 U/L (ref 0–37)
Albumin: 4.7 g/dL (ref 3.5–5.2)
BUN: 13 mg/dL (ref 6–23)
CALCIUM: 10.2 mg/dL (ref 8.4–10.5)
CO2: 30 meq/L (ref 19–32)
CREATININE: 1.14 mg/dL (ref 0.40–1.50)
Chloride: 102 mEq/L (ref 96–112)
GFR: 70.23 mL/min (ref >60.00)
GLUCOSE: 109 mg/dL — AB (ref 70–99)
Potassium: 4.8 mEq/L (ref 3.5–5.1)
Sodium: 141 mEq/L (ref 135–145)
TOTAL PROTEIN: 7.8 g/dL (ref 6.0–8.3)
Total Bilirubin: 0.5 mg/dL (ref 0.2–1.2)

## 2017-12-16 LAB — LIPID PANEL
Cholesterol: 193 mg/dL (ref 0–200)
HDL: 41 mg/dL (ref >39.00)
LDL Cholesterol: 121 mg/dL — ABNORMAL HIGH (ref 0–99)
NonHDL: 152.04
Total CHOL/HDL Ratio: 5
Triglycerides: 156 mg/dL — ABNORMAL HIGH (ref 0.0–149.0)
VLDL: 31.2 mg/dL (ref 0.0–40.0)

## 2017-12-16 LAB — PSA: PSA: 2.3 ng/mL (ref 0.10–4.00)

## 2017-12-16 LAB — VITAMIN B12: Vitamin B-12: 403 pg/mL (ref 211–911)

## 2017-12-16 LAB — VITAMIN D 25 HYDROXY (VIT D DEFICIENCY, FRACTURES): VITD: 32.75 ng/mL (ref 30.00–100.00)

## 2017-12-16 LAB — TSH: TSH: 0.82 u[IU]/mL (ref 0.35–4.50)

## 2017-12-16 NOTE — Patient Instructions (Signed)

## 2017-12-16 NOTE — Progress Notes (Signed)
Subjective:  Patient ID: Colton Andersen, male    DOB: July 03, 1960  Age: 57 y.o. MRN: [id number]  CC: Annual Exam and Hypertension   HPI Colton Andersen presents for a CPX.  The discomfort in his right scrotum has resolved.  He denies scrotal swelling, dysuria, hematuria, rash, or lymphadenopathy.  He is very active and denies any recent episodes of CP, DOE, palpitations, edema, or fatigue.  Outpatient Medications Prior to Visit  Medication Sig Dispense Refill  . diazepam (VALIUM) 10 MG tablet Take 10 mg by mouth.    . lamoTRIgine (LAMICTAL) 200 MG tablet Take 200 mg by mouth daily.    . QUEtiapine (SEROQUEL) 400 MG tablet TK 1 AND 1/2 TS PO QHS  4  . Diclofenac (ZORVOLEX) 18 MG CAPS Take 1 capsule by mouth 3 (three) times daily with meals as needed. 12 capsule 0  . 0.9 %  sodium chloride infusion      No facility-administered medications prior to visit.     ROS Review of Systems  Constitutional: Negative for diaphoresis and fatigue.  HENT: Negative.   Eyes: Negative for visual disturbance.  Respiratory: Negative for cough, chest tightness, shortness of breath and wheezing.   Cardiovascular: Negative for chest pain, palpitations and leg swelling.  Gastrointestinal: Negative for abdominal pain, blood in stool, constipation, nausea and vomiting.  Endocrine: Negative.   Genitourinary: Negative.  Negative for difficulty urinating, dysuria, scrotal swelling, testicular pain and urgency.  Musculoskeletal: Negative.  Negative for arthralgias and myalgias.  Skin: Negative.  Negative for color change and rash.  Neurological: Negative.  Negative for dizziness, weakness, light-headedness and headaches.  Hematological: Negative for adenopathy. Does not bruise/bleed easily.  Psychiatric/Behavioral: Negative.     Objective:  BP 140/90 (BP Location: Left Arm, Patient Position: Sitting, Cuff Size: Normal)   Pulse 83   Temp 98 F (36.7 C) (Oral)   Resp 16   Ht 5\' 8"  (1.727 m)   Wt  155 lb 12 oz (70.6 kg)   SpO2 99%   BMI 23.68 kg/m   BP Readings from Last 3 Encounters:  12/16/17 140/90  11/05/17 140/78  10/21/17 140/90    Wt Readings from Last 3 Encounters:  12/16/17 155 lb 12 oz (70.6 kg)  11/05/17 158 lb (71.7 kg)  10/21/17 159 lb (72.1 kg)    Physical Exam  Constitutional: No distress.  HENT:  Mouth/Throat: Oropharynx is clear and moist. No oropharyngeal exudate.  Eyes: Conjunctivae are normal. No scleral icterus.  Neck: Normal range of motion. Neck supple. No JVD present. No thyromegaly present.  Cardiovascular: Normal rate, regular rhythm and normal heart sounds. Exam reveals no gallop and no friction rub.  No murmur heard. EKG ---  Sinus  Rhythm  WITHIN NORMAL LIMITS   Pulmonary/Chest: Effort normal and breath sounds normal. No respiratory distress. He has no wheezes. He has no rhonchi. He has no rales.  Abdominal: Soft. Normal appearance and bowel sounds are normal. He exhibits no mass. There is no hepatosplenomegaly. There is no tenderness. No hernia. Hernia confirmed negative in the right inguinal area and confirmed negative in the left inguinal area.  Genitourinary: Rectum normal, testes normal and penis normal. Rectal exam shows no external hemorrhoid, no internal hemorrhoid, no fissure, no mass, no tenderness, anal tone normal and guaiac negative stool. Prostate is enlarged (1+ smooth symm BPH). Prostate is not tender. Right testis shows no mass, no swelling and no tenderness. Left testis shows no mass, no swelling and no tenderness. Circumcised.  No penile erythema or penile tenderness. No discharge found.  Genitourinary Comments: There are small, bilateral, epididymal cysts that are nontender nonfluctuant nonswollen.  Lymphadenopathy:    He has no cervical adenopathy. No inguinal adenopathy noted on the right or left side.  Skin: He is not diaphoretic.  Vitals reviewed.   Lab Results  Component Value Date   WBC 5.6 12/16/2017   HGB 15.0  12/16/2017   HCT 43.0 12/16/2017   PLT 237.0 12/16/2017   GLUCOSE 109 (H) 12/16/2017   CHOL 193 12/16/2017   TRIG 156.0 (H) 12/16/2017   HDL 41.00 12/16/2017   LDLDIRECT 153.2 11/08/2008   LDLCALC 121 (H) 12/16/2017   ALT 14 12/16/2017   AST 16 12/16/2017   NA 141 12/16/2017   K 4.8 12/16/2017   CL 102 12/16/2017   CREATININE 1.14 12/16/2017   BUN 13 12/16/2017   CO2 30 12/16/2017   TSH 0.82 12/16/2017   PSA 2.30 12/16/2017   INR 1.0 03/29/2014   HGBA1C 5.5 06/28/2008    US Scrotum W/doppler  Result Date: 11/05/2017 CLINICAL DATA:  Right epididymitis, microscopic hematuria. EXAM: SCROTAL ULTRASOUND DOPPLER ULTRASOUND OF THE TESTICLES TECHNIQUE: Complete ultrasound examination of the testicles, epididymis, and other scrotal structures was performed. Color and spectral Doppler ultrasound were also utilized to evaluate blood flow to the testicles. COMPARISON:  None. FINDINGS: Right testicle Measurements: 4.8 x 3.6 x 2.2 cm. No mass or microlithiasis visualized. Left testicle Measurements: 4.2 x 3.0 x 1.8 cm. No mass or microlithiasis visualized. Right epididymis: Mildly enlarged with slightly increased flow on Doppler consistent with hyperemia. Left epididymis:  Normal in size and appearance. Hydrocele:  None visualized. Varicocele:  None visualized. Pulsed Doppler interrogation of both testes demonstrates normal low resistance arterial and venous waveforms bilaterally. IMPRESSION: Findings consistent with mild right-sided epididymitis. No evidence of testicular mass or torsion. Electronically Signed   By: Marijo Conception, M.D.   On: 11/05/2017 12:15    Assessment & Plan:   Colton Andersen was seen today for annual exam and hypertension.  Diagnoses and all orders for this visit:  Vitamin B12 deficiency neuropathy ([place])- His B12 level is normal now on an oral B12 supplement. -     CBC with Differential/Platelet; Future -     Vitamin B12; Future -     Folate; Future  Routine general medical  examination at a health care facility- Exam completed, labs reviewed, he was given a Tdap, he refused a flu vaccine, colon cancer screening is up-to-date, patient education material was given. -     Lipid panel; Future -     PSA; Future  Hematuria, microscopic- His urinalysis is normal now. -     Urinalysis, Routine w reflex microscopic; Future  Hyperlipidemia LDL goal <130- He does not have an elevated ASCVD risk score so I do not recommend a statin at this time. -     Comprehensive metabolic panel; Future -     TSH; Future  Hep C w/o coma, chronic (HCC)- His LFTs remain normal status post treatment for hep C. -     Comprehensive metabolic panel; Future  Gastroesophageal reflux disease without esophagitis- Symptoms are well controlled. -     CBC with Differential/Platelet; Future  Need for Tdap vaccination -     Tdap vaccine greater than or equal to 7yo IM  Essential hypertension- He has stage I hypertension.  His EKG is negative for LVH.  His labs are negative for secondary causes or endorgan damage.  He  is not willing to take an antihypertensive at this time.  He will work on his lifestyle modifications and I will recheck his blood pressure in about 6 months.  He will let me know sooner if his blood pressure rises. -     EKG 12-Lead -     VITAMIN D 25 Hydroxy (Vit-D Deficiency, Fractures); Future  Epididymitis, right- This has resolved.   I have discontinued Hinda Lenis. Hold[place]'s Diclofenac. I am also having him maintain his lamoTRIgine, diazepam, and QUEtiapine. We will stop administering sodium chloride.  No orders of the defined types were placed in this encounter.    Follow-up: Return in about 6 months (around 06/16/2018).  Scarlette Calico, MD

## 2018-01-28 DIAGNOSIS — F411 Generalized anxiety disorder: Secondary | ICD-10-CM | POA: Diagnosis not present

## 2018-01-28 DIAGNOSIS — F3132 Bipolar disorder, current episode depressed, moderate: Secondary | ICD-10-CM | POA: Diagnosis not present

## 2018-04-04 ENCOUNTER — Encounter: Payer: Self-pay | Admitting: Internal Medicine

## 2018-04-04 ENCOUNTER — Ambulatory Visit: Payer: BLUE CROSS/BLUE SHIELD | Admitting: Internal Medicine

## 2018-04-04 DIAGNOSIS — I10 Essential (primary) hypertension: Secondary | ICD-10-CM

## 2018-04-04 DIAGNOSIS — R062 Wheezing: Secondary | ICD-10-CM

## 2018-04-04 DIAGNOSIS — R059 Cough, unspecified: Secondary | ICD-10-CM

## 2018-04-04 DIAGNOSIS — R05 Cough: Secondary | ICD-10-CM

## 2018-04-04 MED ORDER — HYDROCODONE-HOMATROPINE 5-1.5 MG/5ML PO SYRP: 5.0000 mL | ORAL_SOLUTION | Freq: Four times a day (QID) | ORAL | 0 refills | Status: AC | PRN

## 2018-04-04 MED ORDER — PREDNISONE 10 MG PO TABS: ORAL_TABLET | ORAL | 0 refills | Status: DC

## 2018-04-04 MED ORDER — AZITHROMYCIN 250 MG PO TABS: ORAL_TABLET | ORAL | 1 refills | Status: DC

## 2018-04-04 NOTE — Assessment & Plan Note (Signed)
stable overall by history and exam, recent data reviewed with pt, and pt to continue medical treatment as before,  to f/u any worsening symptoms or concerns  
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
## 2018-04-04 NOTE — Assessment & Plan Note (Signed)
/  Mild to mod, for predpac asd,  to f/u any worsening symptoms or concerns 

## 2018-04-04 NOTE — Patient Instructions (Signed)
Please take all new medication as prescribed - the antibiotic, cough medicine as needed, and prednisone  Please continue all other medications as before, and refills have been done if requested.  Please have the pharmacy call with any other refills you may need.  Please keep your appointments with your specialists as you may have planned   

## 2018-04-04 NOTE — Assessment & Plan Note (Signed)
Mild to mod, for antibx course,  Cough med prn,to f/u any worsening symptoms or concerns 

## 2018-06-06 DIAGNOSIS — M545 Low back pain: Secondary | ICD-10-CM | POA: Diagnosis not present

## 2018-06-24 DIAGNOSIS — F411 Generalized anxiety disorder: Secondary | ICD-10-CM | POA: Diagnosis not present

## 2018-06-24 DIAGNOSIS — F3132 Bipolar disorder, current episode depressed, moderate: Secondary | ICD-10-CM | POA: Diagnosis not present

## 2018-06-30 ENCOUNTER — Telehealth: Payer: Self-pay | Admitting: Internal Medicine

## 2018-06-30 ENCOUNTER — Other Ambulatory Visit: Payer: Self-pay | Admitting: Internal Medicine

## 2018-06-30 DIAGNOSIS — N451 Epididymitis: Secondary | ICD-10-CM

## 2018-06-30 MED ORDER — LEVOFLOXACIN 500 MG PO TABS: 500.0000 mg | ORAL_TABLET | Freq: Every day | ORAL | 0 refills | Status: AC

## 2018-06-30 NOTE — Telephone Encounter (Signed)
Copied from [place] [id number]. Topic: General - Other >> Jun 30, 2018 11:39 AM Valla Leaver wrote: Reason for CRM: Patient decline dappt and wants antibiotic called in for epiditomitis which Dr. Ronnald Ramp has been manaigng him for.

## 2018-06-30 NOTE — Telephone Encounter (Signed)
Pt was seen in August for epididymitis. Pt is refusing an appt.   Please advise if virtual visit is appropriate.

## 2018-06-30 NOTE — Telephone Encounter (Signed)
RX sent

## 2018-07-02 NOTE — Telephone Encounter (Signed)
Pt informed rx has been sent in.  

## 2018-10-29 ENCOUNTER — Encounter: Payer: Self-pay | Admitting: Internal Medicine

## 2018-10-29 ENCOUNTER — Ambulatory Visit (INDEPENDENT_AMBULATORY_CARE_PROVIDER_SITE_OTHER): Payer: BLUE CROSS/BLUE SHIELD | Admitting: Internal Medicine

## 2018-10-29 ENCOUNTER — Other Ambulatory Visit: Payer: Self-pay

## 2018-10-29 ENCOUNTER — Other Ambulatory Visit: Payer: BLUE CROSS/BLUE SHIELD

## 2018-10-29 VITALS — BP 142/90 | HR 80 | Temp 98.0°F | Resp 16 | Ht 68.0 in | Wt 161.0 lb

## 2018-10-29 DIAGNOSIS — L989 Disorder of the skin and subcutaneous tissue, unspecified: Secondary | ICD-10-CM

## 2018-10-29 DIAGNOSIS — L820 Inflamed seborrheic keratosis: Secondary | ICD-10-CM | POA: Diagnosis not present

## 2018-10-29 NOTE — Patient Instructions (Addendum)
Biopsia de piel, cuidados posteriores Skin Biopsy, Care After Esta hoja le brinda informacin sobre cmo cuidarse despus del procedimiento. El mdico tambin podr darle indicaciones ms especficas. Comunquese con el mdico si tiene problemas o preguntas. Qu puedo esperar despus del procedimiento? Despus del procedimiento, es comn [employer] siguientes sntomas:  [place].  [place].  [place]. Siga estas indicaciones en su casa: El cuidado del lugar de la biopsia Siga las indicaciones del mdico acerca de los cuidados en el lugar de la biopsia. Asegrese de hacer lo siguiente:  Lvese las manos con agua y [place] antes y despus de [place] manager la venda (vendaje). Use desinfectante para manos si no dispone de [place] y [place].  Aplquese ungento en el lugar de la biopsia segn las indicaciones del mdico.  Cambie los vendajes como se lo haya indicado el mdico.  No retire los puntos (suturas), la goma para cerrar la piel o las tiras [place]. Es posible que estos cierres cutneos Animal nutritionist en la piel durante 2semanas o ms tiempo. Si los bordes de las tiras adhesivas empiezan a despegarse y Therapist, sports, puede recortar los que estn sueltos. No retire las tiras [facility] por completo a menos que el mdico se lo indique.  Si la zona de la biopsia sangra, aplique una leve presin durante 55minutos. Verifique TEFL teacher de la biopsia todos los das para detectar signos de infeccin. Est atento a los siguientes signos:  Enrojecimiento, [place].  Lquido o sangre.  Calor.  Pus o mal olor.  Indicaciones generales  Descanse y luego reanude sus actividades normales como se lo haya indicado el mdico.  Tome los medicamentos de venta libre y los recetados solamente como se lo haya indicado el mdico.  Consulting civil engineer a todas las visitas de seguimiento como se lo haya indicado el mdico. Esto es importante. Comunquese con un mdico si:  Tiene enrojecimiento, hinchazn o  dolor alrededor del lugar de la biopsia.  Observa lquido o sangre que salen del lugar de la biopsia.  El lugar de la biopsia se siente caliente al tacto.  Tiene pus o percibe mal olor que proviene del lugar de la biopsia.  Tiene fiebre.  Las suturas, la goma para cerrar la piel o las tiras [place] se aflojan o se salen antes de lo previsto. Solicite ayuda inmediatamente si:  Tiene sangrado que no se detiene con la presin o un vendaje. Resumen  Despus del procedimiento, es frecuente Patent attorney, moretones y Futures trader.  Siga las indicaciones del mdico acerca de los cuidados en el lugar de la biopsia.  Verifique TEFL teacher de la biopsia todos los das para detectar signos de infeccin.  Comunquese con un mdico si tiene enrojecimiento, hinchazn o dolor alrededor del lugar de la biopsia o si este est caliente al tacto.  Concurra a todas las visitas de seguimiento como se lo haya indicado el mdico. Esto es importante. Esta informacin no tiene Marine scientist el consejo del mdico. Asegrese de hacerle al mdico cualquier pregunta que tenga. Document Released: 04/08/2015 Document Revised: 10/25/2017 Document Reviewed: 10/25/2017 Elsevier Patient Education  2020 [place].  Skin Biopsy, Care After This sheet gives you information about how to care for yourself after your procedure. Your health care provider may also give you more specific instructions. If you have problems or questions, contact your health care provider. What can I expect after the procedure? After the procedure, it is common to have:  Soreness.  Bruising.  Itching. Follow these instructions at home:  Biopsy site care Follow instructions from your health care provider about how to take care of your biopsy site. Make sure you:  Wash your hands with soap and water before and after you change your bandage (dressing). If soap and water are not available, use hand sanitizer.  Apply ointment  on your biopsy site as directed by your health care provider.  Change your dressing as told by your health care provider.  Leave stitches (sutures), skin glue, or adhesive strips in place. These skin closures may need to stay in place for 2 weeks or longer. If adhesive strip edges start to loosen and curl up, you may trim the loose edges. Do not remove adhesive strips completely unless your health care provider tells you to do that.  If the biopsy area bleeds, apply gentle pressure for 10 minutes. Check your biopsy site every day for signs of infection. Check for:  Redness, swelling, or pain.  Fluid or blood.  Warmth.  Pus or a bad smell.  General instructions  Rest and then return to your normal activities as told by your health care provider.  Take over-the-counter and prescription medicines only as told by your health care provider.  Keep all follow-up visits as told by your health care provider. This is important. Contact a health care provider if:  You have redness, swelling, or pain around your biopsy site.  You have fluid or blood coming from your biopsy site.  Your biopsy site feels warm to the touch.  You have pus or a bad smell coming from your biopsy site.  You have a fever.  Your sutures, skin glue, or adhesive strips loosen or come off sooner than expected. Get help right away if:  You have bleeding that does not stop with pressure or a dressing. Summary  After the procedure, it is common to have soreness, bruising, and itching at the site.  Follow instructions from your health care provider about how to take care of your biopsy site.  Check your biopsy site every day for signs of infection.  Contact a health care provider if you have redness, swelling, or pain around your biopsy site, or your biopsy site feels warm to the touch.  Keep all follow-up visits as told by your health care provider. This is important. This information is not intended to  replace advice given to you by your health care provider. Make sure you discuss any questions you have with your health care provider. Document Released: 04/08/2015 Document Revised: 09/09/2017 Document Reviewed: 09/09/2017 Elsevier Patient Education  [place].  Skin Biopsy, Care After This sheet gives you information about how to care for yourself after your procedure. Your health care provider may also give you more specific instructions. If you have problems or questions, contact your health care provider. What can I expect after the procedure? After the procedure, it is common to have:  Soreness.  Bruising.  Itching. Follow these instructions at home: Biopsy site care Follow instructions from your health care provider about how to take care of your biopsy site. Make sure you:  Wash your hands with soap and water before and after you change your bandage (dressing). If soap and water are not available, use hand sanitizer.  Apply ointment on your biopsy site as directed by your health care provider.  Change your dressing as told by your health care provider.  Leave stitches (sutures), skin glue, or adhesive strips in place. These skin closures may need to stay in place  for 2 weeks or longer. If adhesive strip edges start to loosen and curl up, you may trim the loose edges. Do not remove adhesive strips completely unless your health care provider tells you to do that.  If the biopsy area bleeds, apply gentle pressure for 10 minutes. Check your biopsy site every day for signs of infection. Check for:  Redness, swelling, or pain.  Fluid or blood.  Warmth.  Pus or a bad smell.  General instructions  Rest and then return to your normal activities as told by your health care provider.  Take over-the-counter and prescription medicines only as told by your health care provider.  Keep all follow-up visits as told by your health care provider. This is important. Contact a  health care provider if:  You have redness, swelling, or pain around your biopsy site.  You have fluid or blood coming from your biopsy site.  Your biopsy site feels warm to the touch.  You have pus or a bad smell coming from your biopsy site.  You have a fever.  Your sutures, skin glue, or adhesive strips loosen or come off sooner than expected. Get help right away if:  You have bleeding that does not stop with pressure or a dressing. Summary  After the procedure, it is common to have soreness, bruising, and itching at the site.  Follow instructions from your health care provider about how to take care of your biopsy site.  Check your biopsy site every day for signs of infection.  Contact a health care provider if you have redness, swelling, or pain around your biopsy site, or your biopsy site feels warm to the touch.  Keep all follow-up visits as told by your health care provider. This is important. This information is not intended to replace advice given to you by your health care provider. Make sure you discuss any questions you have with your health care provider. Document Released: 04/08/2015 Document Revised: 09/09/2017 Document Reviewed: 09/09/2017 Elsevier Patient Education  2020 [employer].

## 2018-10-29 NOTE — Progress Notes (Signed)
Subjective:  Patient ID: Colton Andersen, male    DOB: [birth year]  Age: 58 y.o. MRN: [id number]  CC: Rash   HPI Colton Andersen presents for he has had a lesion on the very top of his scalp for over 6 months.  His wife has been treating it with a wart product but it has not resolved.  The area sometimes feels scratchy and irritated.  Outpatient Medications Prior to Visit  Medication Sig Dispense Refill  . diazepam (VALIUM) 10 MG tablet Take 10 mg by mouth.    . lamoTRIgine (LAMICTAL) 200 MG tablet Take 200 mg by mouth daily.    . QUEtiapine (SEROQUEL) 400 MG tablet TK 1 AND 1/2 TS PO QHS  4   No facility-administered medications prior to visit.     ROS Review of Systems  All other systems reviewed and are negative.   Objective:  BP (!) 142/90 (BP Location: Left Arm, Patient Position: Sitting, Cuff Size: Normal)   Pulse 80   Temp 98 F (36.7 C) (Oral)   Resp 16   Ht 5\' 8"  (1.727 m)   Wt 161 lb (73 kg)   SpO2 98%   BMI 24.48 kg/m   BP Readings from Last 3 Encounters:  10/29/18 (!) 142/90  04/04/18 122/82  12/16/17 140/90    Wt Readings from Last 3 Encounters:  10/29/18 161 lb (73 kg)  04/04/18 163 lb (73.9 kg)  12/16/17 155 lb 12 oz (70.6 kg)    Physical Exam HENT:     Head:      Lab Results  Component Value Date   WBC 5.6 12/16/2017   HGB 15.0 12/16/2017   HCT 43.0 12/16/2017   PLT 237.0 12/16/2017   GLUCOSE 109 (H) 12/16/2017   CHOL 193 12/16/2017   TRIG 156.0 (H) 12/16/2017   HDL 41.00 12/16/2017   LDLDIRECT 153.2 11/08/2008   LDLCALC 121 (H) 12/16/2017   ALT 14 12/16/2017   AST 16 12/16/2017   NA 141 12/16/2017   K 4.8 12/16/2017   CL 102 12/16/2017   CREATININE 1.14 12/16/2017   BUN 13 12/16/2017   CO2 30 12/16/2017   TSH 0.82 12/16/2017   PSA 2.30 12/16/2017   INR 1.0 03/29/2014   HGBA1C 5.5 06/28/2008    US Scrotum W/doppler  Result Date: 11/05/2017 CLINICAL DATA:  Right epididymitis, microscopic hematuria. EXAM: SCROTAL  ULTRASOUND DOPPLER ULTRASOUND OF THE TESTICLES TECHNIQUE: Complete ultrasound examination of the testicles, epididymis, and other scrotal structures was performed. Color and spectral Doppler ultrasound were also utilized to evaluate blood flow to the testicles. COMPARISON:  None. FINDINGS: Right testicle Measurements: 4.8 x 3.6 x 2.2 cm. No mass or microlithiasis visualized. Left testicle Measurements: 4.2 x 3.0 x 1.8 cm. No mass or microlithiasis visualized. Right epididymis: Mildly enlarged with slightly increased flow on Doppler consistent with hyperemia. Left epididymis:  Normal in size and appearance. Hydrocele:  None visualized. Varicocele:  None visualized. Pulsed Doppler interrogation of both testes demonstrates normal low resistance arterial and venous waveforms bilaterally. IMPRESSION: Findings consistent with mild right-sided epididymitis. No evidence of testicular mass or torsion. Electronically Signed   By: Marijo Conception, M.D.   On: 11/05/2017 12:15   After informed written consent was obtained, using Betadine for cleansing and 1% Lidocaine with epinephrine for anesthetic, with sterile technique a 4 mm punch biopsy was used to obtain a biopsy specimen of the lesion. Hemostasis was obtained by pressure and wound was closed with 1 simple suture 4.0  nylon on a PC-3. Antibiotic dressing is applied, and wound care instructions provided. Be alert for any signs of cutaneous infection. The specimen is labeled and sent to pathology for evaluation. The procedure was well tolerated without complications.  Assessment & Plan:   Colton Andersen was seen today for rash.  Diagnoses and all orders for this visit:  Skin lesion- This looks like basal cell carcinoma.  Specimen sent for pathology to confirm.  If it is positive for basal cell carcinoma then I will refer him to a skin cancer specialist. -     Dermatology pathology; Future   I am having Maureen Chatters maintain his lamoTRIgine, diazepam, and  QUEtiapine.  No orders of the defined types were placed in this encounter.    Follow-up: Return in about 1 week (around 11/05/2018).  Scarlette Calico, MD

## 2018-11-05 ENCOUNTER — Other Ambulatory Visit: Payer: Self-pay

## 2018-11-05 ENCOUNTER — Ambulatory Visit (INDEPENDENT_AMBULATORY_CARE_PROVIDER_SITE_OTHER): Payer: BLUE CROSS/BLUE SHIELD | Admitting: Internal Medicine

## 2018-11-05 ENCOUNTER — Encounter: Payer: Self-pay | Admitting: Internal Medicine

## 2018-11-05 VITALS — BP 150/90 | HR 84 | Temp 98.0°F | Ht 68.0 in | Wt 162.0 lb

## 2018-11-05 DIAGNOSIS — L820 Inflamed seborrheic keratosis: Secondary | ICD-10-CM

## 2018-11-05 DIAGNOSIS — Z4802 Encounter for removal of sutures: Secondary | ICD-10-CM

## 2018-11-07 ENCOUNTER — Encounter: Payer: Self-pay | Admitting: Internal Medicine

## 2018-11-07 DIAGNOSIS — L820 Inflamed seborrheic keratosis: Secondary | ICD-10-CM | POA: Insufficient documentation

## 2018-11-07 NOTE — Patient Instructions (Signed)
Seborrheic Keratosis A seborrheic keratosis is a common, noncancerous (benign) skin growth. These growths are velvety, waxy, rough, tan, brown, or black spots that appear on the skin. These skin growths can be flat or raised, and scaly. What are the causes? The cause of this condition is not known. What increases the risk? You are more likely to develop this condition if you:  Have a family history of seborrheic keratosis.  Are 50 or older.  Are pregnant.  Have had estrogen replacement therapy. What are the signs or symptoms? Symptoms of this condition include growths on the face, chest, shoulders, back, or other areas. These growths:  Are usually painless, but may become irritated and itchy.  Can be yellow, brown, black, or other colors.  Are slightly raised or have a flat surface.  Are sometimes rough or wart-like in texture.  Are often velvety or waxy on the surface.  Are round or oval-shaped.  Often occur in groups, but may occur as a single growth. How is this diagnosed? This condition is diagnosed with a medical history and physical exam.  A sample of the growth may be tested (skin biopsy).  You may need to see a skin specialist (dermatologist). How is this treated? Treatment is not usually needed for this condition, unless the growths are irritated or bleed often.  You may also choose to have the growths removed if you do not like their appearance. ? Most commonly, these growths are treated with a procedure in which liquid nitrogen is applied to "freeze" off the growth (cryosurgery). ? They may also be burned off with electricity (electrocautery) or removed by scraping (curettage). Follow these instructions at home:  Watch your growth for any changes.  Keep all follow-up visits as told by your health care provider. This is important.  Do not scratch or pick at the growth or growths. This can cause them to become irritated or infected. Contact a health care  provider if:  You suddenly have many new growths.  Your growth bleeds, itches, or hurts.  Your growth suddenly becomes larger or changes color. Summary  A seborrheic keratosis is a common, noncancerous (benign) skin growth.  Treatment is not usually needed for this condition, unless the growths are irritated or bleed often.  Watch your growth for any changes.  Contact a health care provider if you suddenly have many new growths or your growth suddenly becomes larger or changes color.  Keep all follow-up visits as told by your health care provider. This is important. This information is not intended to replace advice given to you by your health care provider. Make sure you discuss any questions you have with your health care provider. Document Released: 04/14/2010 Document Revised: 07/25/2017 Document Reviewed: 07/25/2017 Elsevier Patient Education  2020 [employer]  

## 2018-11-07 NOTE — Progress Notes (Signed)
Subjective:  Patient ID: Colton Andersen, male    DOB: Oct 11, 1960  Age: 58 y.o. MRN: [id number]  CC: Wound Check   HPI Colton Andersen presents for f/up - He underwent a biopsy on the top of his scalp 1 week ago.  The biopsy results revealed an irritated seborrheic keratosis.  He tells me the biopsy site has healed nicely and he has no complaints.  He needs to have the suture removed.  Outpatient Medications Prior to Visit  Medication Sig Dispense Refill  . diazepam (VALIUM) 10 MG tablet Take 10 mg by mouth.    . lamoTRIgine (LAMICTAL) 200 MG tablet Take 200 mg by mouth daily.    . QUEtiapine (SEROQUEL) 400 MG tablet TK 1 AND 1/2 TS PO QHS  4   No facility-administered medications prior to visit.     ROS Review of Systems  Skin: Positive for wound. Negative for color change.  All other systems reviewed and are negative.   Objective:  BP (!) 150/90 (BP Location: Left Arm, Patient Position: Sitting, Cuff Size: Normal)   Pulse 84   Temp 98 F (36.7 C) (Oral)   Ht 5\' 8"  (1.727 m)   Wt 162 lb (73.5 kg)   SpO2 95%   BMI 24.63 kg/m   BP Readings from Last 3 Encounters:  11/05/18 (!) 150/90  10/29/18 (!) 142/90  04/04/18 122/82    Wt Readings from Last 3 Encounters:  11/05/18 162 lb (73.5 kg)  10/29/18 161 lb (73 kg)  04/04/18 163 lb (73.9 kg)    Physical Exam HENT:     Head:      Lab Results  Component Value Date   WBC 5.6 12/16/2017   HGB 15.0 12/16/2017   HCT 43.0 12/16/2017   PLT 237.0 12/16/2017   GLUCOSE 109 (H) 12/16/2017   CHOL 193 12/16/2017   TRIG 156.0 (H) 12/16/2017   HDL 41.00 12/16/2017   LDLDIRECT 153.2 11/08/2008   LDLCALC 121 (H) 12/16/2017   ALT 14 12/16/2017   AST 16 12/16/2017   NA 141 12/16/2017   K 4.8 12/16/2017   CL 102 12/16/2017   CREATININE 1.14 12/16/2017   BUN 13 12/16/2017   CO2 30 12/16/2017   TSH 0.82 12/16/2017   PSA 2.30 12/16/2017   INR 1.0 03/29/2014   HGBA1C 5.5 06/28/2008    US Scrotum W/doppler   Result Date: 11/05/2017 CLINICAL DATA:  Right epididymitis, microscopic hematuria. EXAM: SCROTAL ULTRASOUND DOPPLER ULTRASOUND OF THE TESTICLES TECHNIQUE: Complete ultrasound examination of the testicles, epididymis, and other scrotal structures was performed. Color and spectral Doppler ultrasound were also utilized to evaluate blood flow to the testicles. COMPARISON:  None. FINDINGS: Right testicle Measurements: 4.8 x 3.6 x 2.2 cm. No mass or microlithiasis visualized. Left testicle Measurements: 4.2 x 3.0 x 1.8 cm. No mass or microlithiasis visualized. Right epididymis: Mildly enlarged with slightly increased flow on Doppler consistent with hyperemia. Left epididymis:  Normal in size and appearance. Hydrocele:  None visualized. Varicocele:  None visualized. Pulsed Doppler interrogation of both testes demonstrates normal low resistance arterial and venous waveforms bilaterally. IMPRESSION: Findings consistent with mild right-sided epididymitis. No evidence of testicular mass or torsion. Electronically Signed   By: Marijo Conception, M.D.   On: 11/05/2017 12:15    Diagnosis Skin , top of upper scalp SEBORRHEIC KERATOSIS, IRRITATED Microscopic Description There is acanthosis with keratinocytic proliferation in the epidermis and hyperkeratosis. The keratinocytes are slightly enlarged without atypia. A reactive inflammatory infiltrate is present.  The changes are those of an irritated seborrheic keratosis  Assessment & Plan:   [name] was seen today for wound check.  Diagnoses and all orders for this visit:  Seborrheic keratoses, inflamed - Suture removed.  Biopsy site has healed nicely.  He was offered reassurance that this is not skin cancer.  He agrees to wear sunscreen and to protect his skin from exposure to the sun.  He will let me know if he develops any new lesions.   I am having Maureen Chatters maintain his lamoTRIgine, diazepam, and QUEtiapine.  No orders of the defined types were placed  in this encounter.    Follow-up: Return in about 4 weeks (around 12/03/2018).  Scarlette Calico, MD

## 2018-12-23 DIAGNOSIS — F3132 Bipolar disorder, current episode depressed, moderate: Secondary | ICD-10-CM | POA: Diagnosis not present

## 2019-02-11 ENCOUNTER — Other Ambulatory Visit: Payer: Self-pay

## 2019-02-11 ENCOUNTER — Encounter: Payer: Self-pay | Admitting: Internal Medicine

## 2019-02-11 ENCOUNTER — Other Ambulatory Visit (INDEPENDENT_AMBULATORY_CARE_PROVIDER_SITE_OTHER): Payer: BC Managed Care – PPO

## 2019-02-11 ENCOUNTER — Ambulatory Visit (INDEPENDENT_AMBULATORY_CARE_PROVIDER_SITE_OTHER): Payer: BC Managed Care – PPO | Admitting: Internal Medicine

## 2019-02-11 VITALS — BP 144/82 | HR 89 | Temp 97.9°F | Resp 16 | Ht 68.0 in | Wt 172.0 lb

## 2019-02-11 DIAGNOSIS — Z125 Encounter for screening for malignant neoplasm of prostate: Secondary | ICD-10-CM

## 2019-02-11 DIAGNOSIS — G63 Polyneuropathy in diseases classified elsewhere: Secondary | ICD-10-CM

## 2019-02-11 DIAGNOSIS — E538 Deficiency of other specified B group vitamins: Secondary | ICD-10-CM | POA: Diagnosis not present

## 2019-02-11 DIAGNOSIS — E785 Hyperlipidemia, unspecified: Secondary | ICD-10-CM | POA: Diagnosis not present

## 2019-02-11 DIAGNOSIS — I10 Essential (primary) hypertension: Secondary | ICD-10-CM

## 2019-02-11 DIAGNOSIS — Z0000 Encounter for general adult medical examination without abnormal findings: Secondary | ICD-10-CM | POA: Diagnosis not present

## 2019-02-11 LAB — HEPATIC FUNCTION PANEL
ALT: 17 U/L (ref 0–53)
AST: 15 U/L (ref 0–37)
Albumin: 4.5 g/dL (ref 3.5–5.2)
Alkaline Phosphatase: 18 U/L — ABNORMAL LOW (ref 39–117)
Bilirubin, Direct: 0.1 mg/dL (ref 0.0–0.3)
Total Bilirubin: 0.4 mg/dL (ref 0.2–1.2)
Total Protein: 7.1 g/dL (ref 6.0–8.3)

## 2019-02-11 LAB — CBC WITH DIFFERENTIAL/PLATELET
Basophils Absolute: 0.1 10*3/uL (ref 0.0–0.1)
Basophils Relative: 1 % (ref 0.0–3.0)
Eosinophils Absolute: 0.2 10*3/uL (ref 0.0–0.7)
Eosinophils Relative: 2.7 % (ref 0.0–5.0)
HCT: 40.5 % (ref 39.0–52.0)
Hemoglobin: 13.7 g/dL (ref 13.0–17.0)
Lymphocytes Relative: 37.9 % (ref 12.0–46.0)
Lymphs Abs: 2.7 10*3/uL (ref 0.7–4.0)
MCHC: 33.9 g/dL (ref 30.0–36.0)
MCV: 88.4 fl (ref 78.0–100.0)
Monocytes Absolute: 0.7 10*3/uL (ref 0.1–1.0)
Monocytes Relative: 9.5 % (ref 3.0–12.0)
Neutro Abs: 3.5 10*3/uL (ref 1.4–7.7)
Neutrophils Relative %: 48.9 % (ref 43.0–77.0)
Platelets: 229 10*3/uL (ref 150.0–400.0)
RBC: 4.58 Mil/uL (ref 4.22–5.81)
RDW: 13.9 % (ref 11.5–15.5)
WBC: 7.1 10*3/uL (ref 4.0–10.5)

## 2019-02-11 LAB — BASIC METABOLIC PANEL
BUN: 13 mg/dL (ref 6–23)
CO2: 31 mEq/L (ref 19–32)
Calcium: 9.7 mg/dL (ref 8.4–10.5)
Chloride: 103 mEq/L (ref 96–112)
Creatinine, Ser: 1.24 mg/dL (ref 0.40–1.50)
GFR: 59.72 mL/min — ABNORMAL LOW (ref >60.00)
Glucose, Bld: 114 mg/dL — ABNORMAL HIGH (ref 70–99)
Potassium: 4.5 mEq/L (ref 3.5–5.1)
Sodium: 141 mEq/L (ref 135–145)

## 2019-02-11 LAB — PSA: PSA: 1.9 ng/mL (ref 0.10–4.00)

## 2019-02-11 LAB — URINALYSIS, ROUTINE W REFLEX MICROSCOPIC
Bilirubin Urine: NEGATIVE
Hgb urine dipstick: NEGATIVE
Ketones, ur: NEGATIVE
Leukocytes,Ua: NEGATIVE
Nitrite: NEGATIVE
RBC / HPF: NONE SEEN (ref 0–?)
Specific Gravity, Urine: 1.01 (ref 1.000–1.030)
Total Protein, Urine: NEGATIVE
Urine Glucose: NEGATIVE
Urobilinogen, UA: 0.2 (ref 0.0–1.0)
pH: 6.5 (ref 5.0–8.0)

## 2019-02-11 LAB — VITAMIN B12: Vitamin B-12: 285 pg/mL (ref 211–911)

## 2019-02-11 LAB — LIPID PANEL
Cholesterol: 181 mg/dL (ref 0–200)
HDL: 36.4 mg/dL — ABNORMAL LOW (ref >39.00)
LDL Cholesterol: 105 mg/dL — ABNORMAL HIGH (ref 0–99)
NonHDL: 144.47
Total CHOL/HDL Ratio: 5
Triglycerides: 197 mg/dL — ABNORMAL HIGH (ref 0.0–149.0)
VLDL: 39.4 mg/dL (ref 0.0–40.0)

## 2019-02-11 LAB — TSH: TSH: 1.14 u[IU]/mL (ref 0.35–4.50)

## 2019-02-11 LAB — FOLATE: Folate: 12.2 ng/mL (ref >5.9)

## 2019-02-11 NOTE — Progress Notes (Signed)
Subjective:  Patient ID: Colton Andersen, male    DOB: [birth year]  Age: 58 y.o. MRN: [id number]  CC: Annual Exam and Hypertension   HPI [facility] [place] presents for a CPX.  He is very active and denies any recent episodes of CP, DOE, palpitations, edema, or fatigue. He does not monitor his blood pressure.  Outpatient Medications Prior to Visit  Medication Sig Dispense Refill  . diazepam (VALIUM) 10 MG tablet Take 10 mg by mouth.    . lamoTRIgine (LAMICTAL) 200 MG tablet Take 200 mg by mouth daily.    . QUEtiapine (SEROQUEL) 400 MG tablet TK 1 AND 1/2 TS PO QHS  4   No facility-administered medications prior to visit.     ROS Review of Systems  Constitutional: Negative.  Negative for appetite change, diaphoresis, fatigue and fever.  HENT: Negative.   Eyes: Negative for visual disturbance.  Respiratory: Negative for cough, chest tightness, shortness of breath and wheezing.   Cardiovascular: Negative for chest pain, palpitations and leg swelling.  Gastrointestinal: Negative for abdominal pain, constipation, diarrhea, nausea and vomiting.  Endocrine: Negative.   Genitourinary: Negative.  Negative for difficulty urinating, scrotal swelling, testicular pain and urgency.  Musculoskeletal: Negative.  Negative for arthralgias and myalgias.  Skin: Negative.  Negative for color change.  Neurological: Negative.  Negative for dizziness, weakness, light-headedness and headaches.  Hematological: Negative for adenopathy. Does not bruise/bleed easily.  Psychiatric/Behavioral: Negative.  Negative for dysphoric mood and sleep disturbance. The patient is not nervous/anxious.     Objective:  BP (!) 144/82 (BP Location: Left Arm, Patient Position: Sitting, Cuff Size: Normal)   Pulse 89   Temp 97.9 F (36.6 C) (Oral)   Resp 16   Ht 5\' 8"  (1.727 m)   Wt 172 lb (78 kg)   SpO2 98%   BMI 26.15 kg/m   BP Readings from Last 3 Encounters:  02/11/19 (!) 144/82  11/05/18 (!) 150/90  10/29/18  (!) 142/90    Wt Readings from Last 3 Encounters:  02/11/19 172 lb (78 kg)  11/05/18 162 lb (73.5 kg)  10/29/18 161 lb (73 kg)    Physical Exam Vitals signs reviewed.  Constitutional:      Appearance: Normal appearance.  HENT:     Nose: Nose normal.     Mouth/Throat:     Mouth: Mucous membranes are moist.  Eyes:     General: No scleral icterus.    Conjunctiva/sclera: Conjunctivae normal.  Neck:     Musculoskeletal: Neck supple.  Cardiovascular:     Rate and Rhythm: Normal rate and regular rhythm.     Heart sounds: No murmur.  Pulmonary:     Effort: Pulmonary effort is normal.     Breath sounds: No stridor. No wheezing, rhonchi or rales.  Abdominal:     General: Abdomen is flat. Bowel sounds are normal. There is no distension.     Palpations: Abdomen is soft. There is no hepatomegaly or splenomegaly.     Tenderness: There is no abdominal tenderness.     Hernia: There is no hernia in the left inguinal area or right inguinal area.  Genitourinary:    Pubic Area: No rash.      Penis: Normal.      Scrotum/Testes: Normal.        Right: Mass not present.        Left: Mass not present.     Epididymis:     Right: Normal. Not enlarged. No mass.  Left: Normal. Not enlarged. No mass.     Prostate: Normal. Not enlarged, not tender and no nodules present.     Rectum: Normal. Guaiac result negative. No mass, tenderness, anal fissure, external hemorrhoid or internal hemorrhoid. Normal anal tone.  Musculoskeletal: Normal range of motion.     Right lower leg: No edema.     Left lower leg: No edema.  Lymphadenopathy:     Cervical: No cervical adenopathy.     Lower Body: No right inguinal adenopathy. No left inguinal adenopathy.  Skin:    General: Skin is warm and dry.     Coloration: Skin is not pale.  Neurological:     General: No focal deficit present.     Mental Status: He is alert.  Psychiatric:        Mood and Affect: Mood normal.        Behavior: Behavior normal.      Lab Results  Component Value Date   WBC 7.1 02/11/2019   HGB 13.7 02/11/2019   HCT 40.5 02/11/2019   PLT 229.0 02/11/2019   GLUCOSE 114 (H) 02/11/2019   CHOL 181 02/11/2019   TRIG 197.0 (H) 02/11/2019   HDL 36.40 (L) 02/11/2019   LDLDIRECT 153.2 11/08/2008   LDLCALC 105 (H) 02/11/2019   ALT 17 02/11/2019   AST 15 02/11/2019   NA 141 02/11/2019   K 4.5 02/11/2019   CL 103 02/11/2019   CREATININE 1.24 02/11/2019   BUN 13 02/11/2019   CO2 31 02/11/2019   TSH 1.14 02/11/2019   PSA 1.90 02/11/2019   INR 1.0 03/29/2014   HGBA1C 5.5 06/28/2008    US Scrotum W/doppler  Result Date: 11/05/2017 CLINICAL DATA:  Right epididymitis, microscopic hematuria. EXAM: SCROTAL ULTRASOUND DOPPLER ULTRASOUND OF THE TESTICLES TECHNIQUE: Complete ultrasound examination of the testicles, epididymis, and other scrotal structures was performed. Color and spectral Doppler ultrasound were also utilized to evaluate blood flow to the testicles. COMPARISON:  None. FINDINGS: Right testicle Measurements: 4.8 x 3.6 x 2.2 cm. No mass or microlithiasis visualized. Left testicle Measurements: 4.2 x 3.0 x 1.8 cm. No mass or microlithiasis visualized. Right epididymis: Mildly enlarged with slightly increased flow on Doppler consistent with hyperemia. Left epididymis:  Normal in size and appearance. Hydrocele:  None visualized. Varicocele:  None visualized. Pulsed Doppler interrogation of both testes demonstrates normal low resistance arterial and venous waveforms bilaterally. IMPRESSION: Findings consistent with mild right-sided epididymitis. No evidence of testicular mass or torsion. Electronically Signed   By: Marijo Conception, M.D.   On: 11/05/2017 12:15    Assessment & Plan:   Commie was seen today for annual exam and hypertension.  Diagnoses and all orders for this visit:  Essential hypertension- His blood pressure is not adequately well controlled.  His labs are negative for secondary causes or endorgan damage.   He is not willing to take an antihypertensive. -     Hepatic function panel; Future -     TSH; Future -     Urinalysis, Routine w reflex microscopic; Future -     Basic metabolic panel; Future  Vitamin B12 deficiency neuropathy ([place])- His B12 and folate levels are normal now. -     CBC with Differential; Future -     B12; Future -     Folate; Future  Routine general medical examination at a health care facility- Exam completed, labs reviewed, vaccines reviewed, cancer screenings are up-to-date, patient education was given. -     Lipid panel; Future -  PSA; Future  Hyperlipidemia LDL goal <130- His ASCVD risk score is less than 15% so I did not recommend a statin for CV risk reduction.   I am having Maureen Chatters maintain his lamoTRIgine, diazepam, and QUEtiapine.  No orders of the defined types were placed in this encounter.    Follow-up: Return in about 6 months (around 08/11/2019).  Scarlette Calico, MD

## 2019-02-11 NOTE — Patient Instructions (Signed)

## 2019-05-24 ENCOUNTER — Other Ambulatory Visit: Payer: Self-pay

## 2019-05-24 ENCOUNTER — Emergency Department (HOSPITAL_COMMUNITY)
Admission: EM | Admit: 2019-05-24 | Discharge: 2019-05-24 | Disposition: A | Discharge status: Home / Self Care | Payer: BC Managed Care – PPO | Attending: Emergency Medicine | Admitting: Emergency Medicine

## 2019-05-24 ENCOUNTER — Emergency Department (HOSPITAL_COMMUNITY): Payer: BC Managed Care – PPO

## 2019-05-24 ENCOUNTER — Encounter (HOSPITAL_COMMUNITY): Payer: Self-pay | Admitting: Emergency Medicine

## 2019-05-24 DIAGNOSIS — M25511 Pain in right shoulder: Secondary | ICD-10-CM

## 2019-05-24 DIAGNOSIS — Z79899 Other long term (current) drug therapy: Secondary | ICD-10-CM | POA: Insufficient documentation

## 2019-05-24 DIAGNOSIS — R600 Localized edema: Secondary | ICD-10-CM | POA: Insufficient documentation

## 2019-05-24 DIAGNOSIS — Z87891 Personal history of nicotine dependence: Secondary | ICD-10-CM | POA: Diagnosis not present

## 2019-05-24 MED ORDER — IBUPROFEN 800 MG PO TABS: 800.0000 mg | ORAL_TABLET | Freq: Three times a day (TID) | ORAL | 0 refills | Status: DC | PRN

## 2019-05-24 MED ORDER — HYDROCODONE-ACETAMINOPHEN 5-325 MG PO TABS: 1.0000 | ORAL_TABLET | ORAL | 0 refills | Status: DC | PRN

## 2019-05-24 MED ORDER — OXYCODONE-ACETAMINOPHEN 5-325 MG PO TABS
1.0000 | ORAL_TABLET | Freq: Once | ORAL | Status: AC
  Administered 2019-05-24: 1 via ORAL
  Filled 2019-05-24: qty 1
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
## 2019-05-24 NOTE — Discharge Instructions (Signed)
Follow-up with your orthopedic surgeon soon as possible.  Use ice over the area that is sore.  Use the sling for comfort.

## 2019-05-24 NOTE — ED Triage Notes (Signed)
Pt reports that last night when he reached over to get popcorn out of the bag started having right shoulder pains. Also reports playing some baseball yesterday and hx rotator surgery on that shoulder.

## 2019-05-25 DIAGNOSIS — M25511 Pain in right shoulder: Secondary | ICD-10-CM | POA: Diagnosis not present

## 2019-05-28 DIAGNOSIS — M25511 Pain in right shoulder: Secondary | ICD-10-CM | POA: Diagnosis not present

## 2019-06-01 DIAGNOSIS — M25511 Pain in right shoulder: Secondary | ICD-10-CM | POA: Diagnosis not present

## 2019-06-24 DIAGNOSIS — F411 Generalized anxiety disorder: Secondary | ICD-10-CM | POA: Diagnosis not present

## 2019-06-24 DIAGNOSIS — F3132 Bipolar disorder, current episode depressed, moderate: Secondary | ICD-10-CM | POA: Diagnosis not present

## 2019-08-18 DIAGNOSIS — M533 Sacrococcygeal disorders, not elsewhere classified: Secondary | ICD-10-CM | POA: Diagnosis not present

## 2019-09-09 DIAGNOSIS — M533 Sacrococcygeal disorders, not elsewhere classified: Secondary | ICD-10-CM | POA: Diagnosis not present

## 2019-10-07 DIAGNOSIS — H9202 Otalgia, left ear: Secondary | ICD-10-CM | POA: Diagnosis not present

## 2019-10-19 DIAGNOSIS — M47816 Spondylosis without myelopathy or radiculopathy, lumbar region: Secondary | ICD-10-CM | POA: Diagnosis not present

## 2019-11-10 DIAGNOSIS — H9203 Otalgia, bilateral: Secondary | ICD-10-CM | POA: Diagnosis not present

## 2019-11-10 DIAGNOSIS — M26623 Arthralgia of bilateral temporomandibular joint: Secondary | ICD-10-CM | POA: Diagnosis not present

## 2019-11-18 DIAGNOSIS — M47816 Spondylosis without myelopathy or radiculopathy, lumbar region: Secondary | ICD-10-CM | POA: Diagnosis not present

## 2019-11-26 DIAGNOSIS — Z1810 Retained metal fragments, unspecified: Secondary | ICD-10-CM | POA: Diagnosis not present

## 2019-11-26 DIAGNOSIS — M545 Low back pain: Secondary | ICD-10-CM | POA: Diagnosis not present

## 2019-12-08 DIAGNOSIS — M545 Low back pain: Secondary | ICD-10-CM | POA: Diagnosis not present

## 2019-12-21 DIAGNOSIS — M25551 Pain in right hip: Secondary | ICD-10-CM | POA: Diagnosis not present

## 2019-12-21 DIAGNOSIS — M545 Low back pain: Secondary | ICD-10-CM | POA: Diagnosis not present

## 2019-12-22 DIAGNOSIS — F3132 Bipolar disorder, current episode depressed, moderate: Secondary | ICD-10-CM | POA: Diagnosis not present

## 2019-12-22 DIAGNOSIS — F411 Generalized anxiety disorder: Secondary | ICD-10-CM | POA: Diagnosis not present

## 2020-01-04 IMAGING — US US SCROTUM W/ DOPPLER COMPLETE
1 series · 14 of 25 positions shown · non-contrast
Comparison: None.

CLINICAL DATA: Right epididymitis, microscopic hematuria.

EXAM:
SCROTAL ULTRASOUND
DOPPLER ULTRASOUND OF THE TESTICLES
TECHNIQUE: Complete ultrasound examination of the testicles, epididymis, and
other scrotal structures was performed. Color and spectral Doppler
ultrasound were also utilized to evaluate blood flow to the
testicles.

[Series 1: us scrotum w/ doppler complete · 0.07mm/px · 14 of 42 slices shown]
[im 1/42]
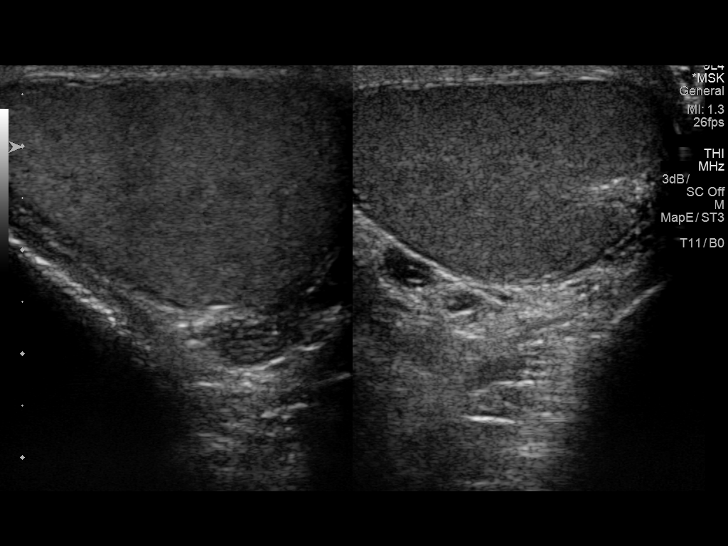
[im 4/42]
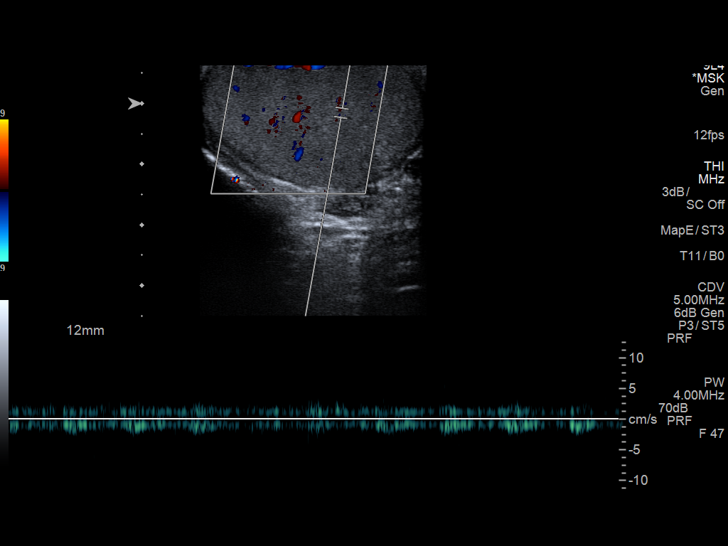
[im 7/42]
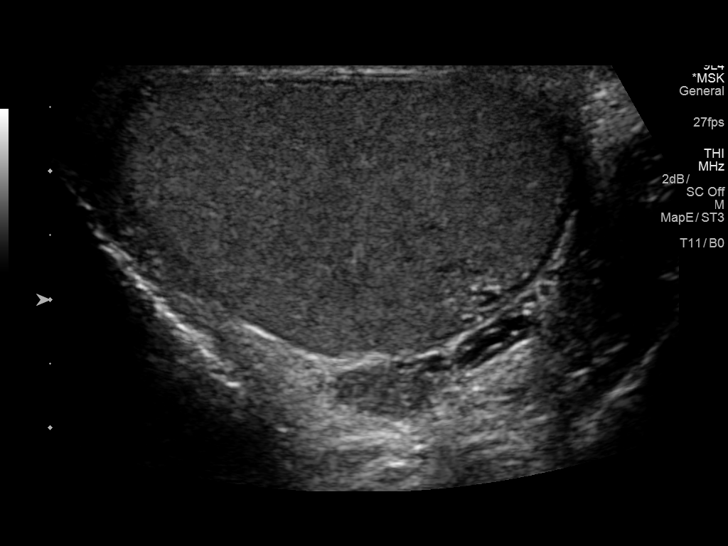
[im 11/42]
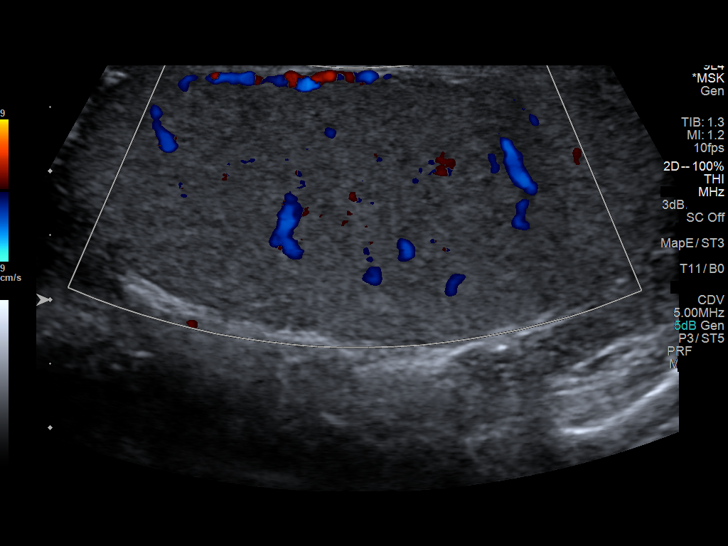
[im 14/42]
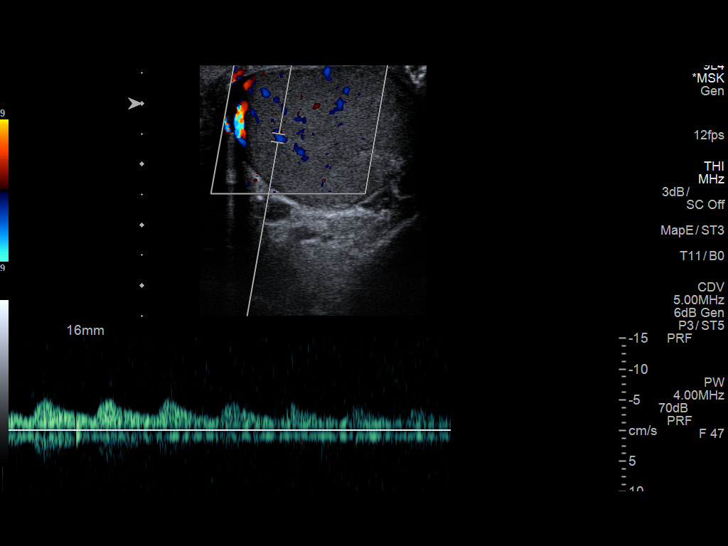
[im 16/42]
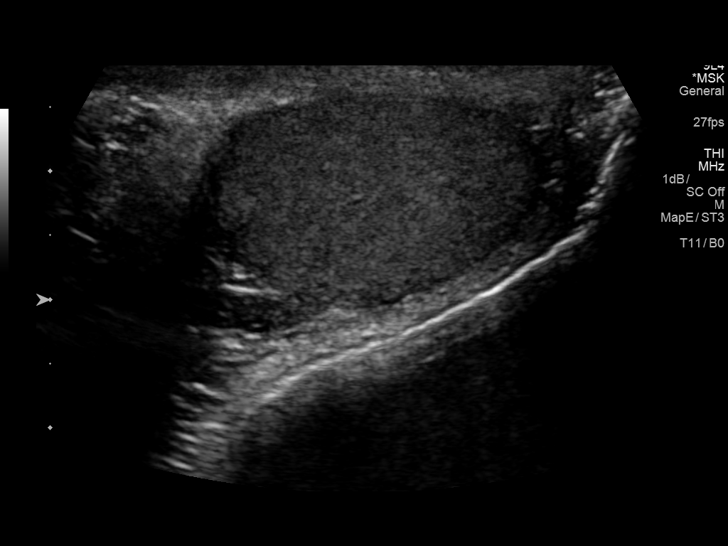
[im 19/42]
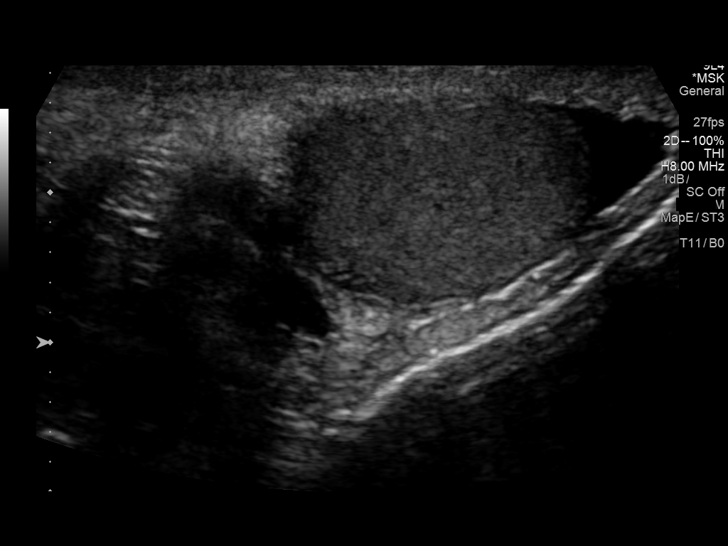
[im 23/42]
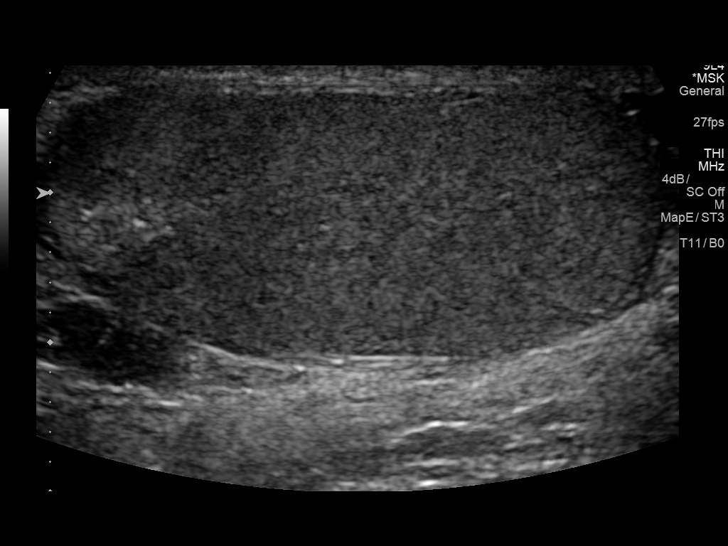
[im 26/42]
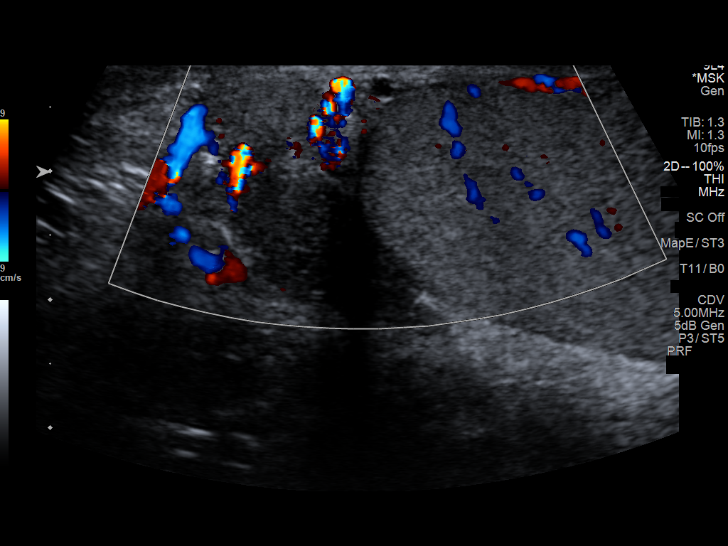
[im 28/42]
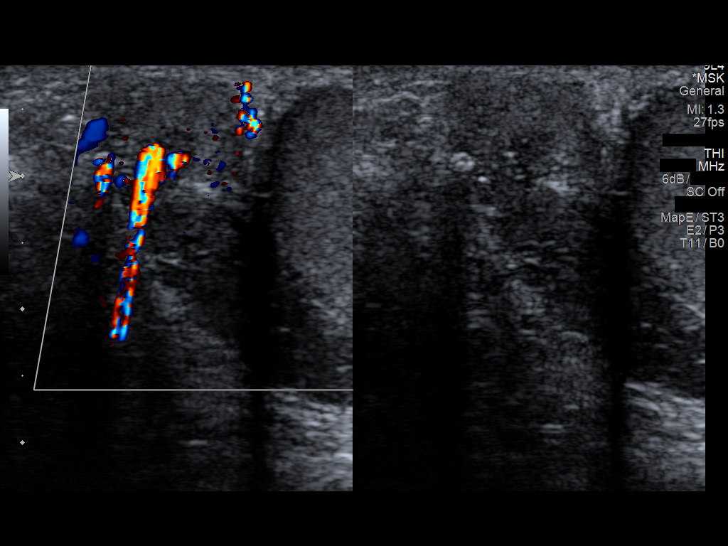
[im 31/42]
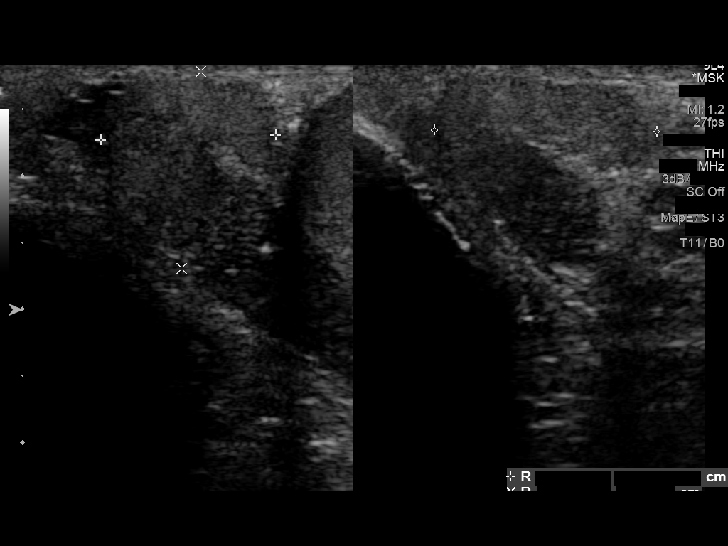
[im 35/42]
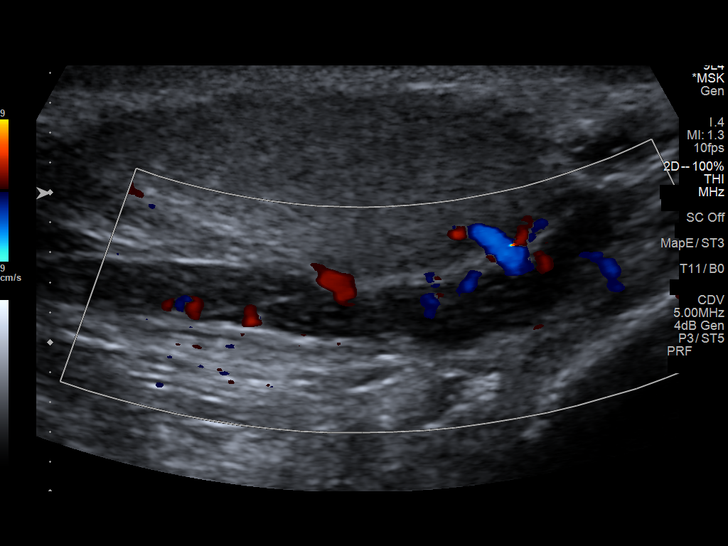
[im 38/42]
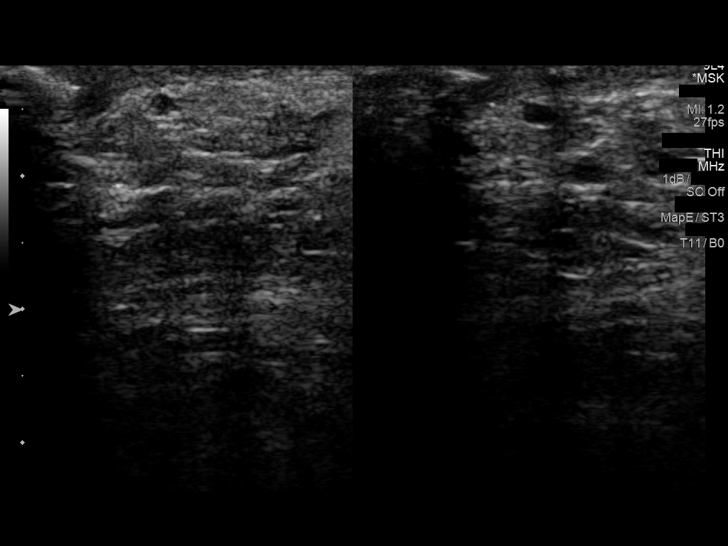
[im 42/42]
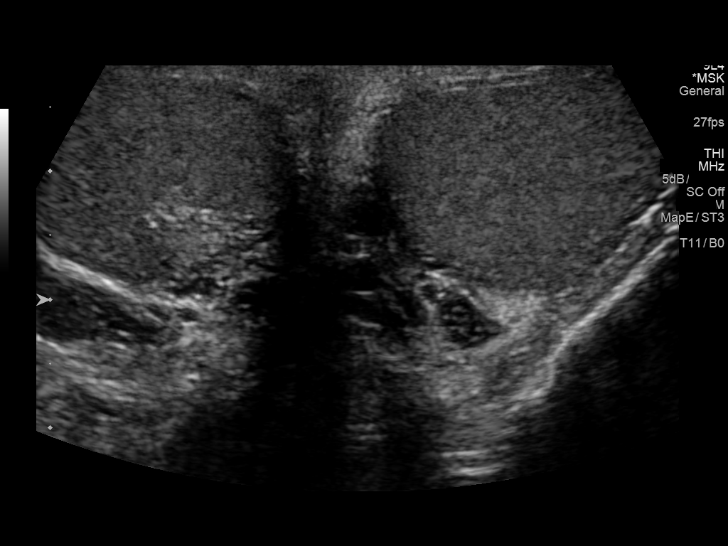

[14 of 25 positions shown; findings below may reference images not displayed]

FINDINGS: Right testicle

Measurements: 4.8 x 3.6 x 2.2 cm. No mass or microlithiasis
visualized.

Left testicle

Measurements: 4.2 x 3.0 x 1.8 cm. No mass or microlithiasis
visualized.

Right epididymis: Mildly enlarged with slightly increased flow on
Doppler consistent with hyperemia.

Left epididymis:  Normal in size and appearance.

Hydrocele:  None visualized.

Varicocele:  None visualized.

Pulsed Doppler interrogation of both testes demonstrates normal low
resistance arterial and venous waveforms bilaterally.
IMPRESSION: Findings consistent with mild right-sided epididymitis. No evidence
of testicular mass or torsion.

## 2020-03-09 DIAGNOSIS — R0981 Nasal congestion: Secondary | ICD-10-CM | POA: Diagnosis not present

## 2020-03-09 DIAGNOSIS — R051 Acute cough: Secondary | ICD-10-CM | POA: Diagnosis not present

## 2020-03-09 DIAGNOSIS — Z20828 Contact with and (suspected) exposure to other viral communicable diseases: Secondary | ICD-10-CM | POA: Diagnosis not present

## 2020-03-09 DIAGNOSIS — R062 Wheezing: Secondary | ICD-10-CM | POA: Diagnosis not present

## 2020-03-09 DIAGNOSIS — J209 Acute bronchitis, unspecified: Secondary | ICD-10-CM | POA: Diagnosis not present

## 2020-03-14 DIAGNOSIS — J069 Acute upper respiratory infection, unspecified: Secondary | ICD-10-CM | POA: Diagnosis not present

## 2020-03-14 DIAGNOSIS — Z20828 Contact with and (suspected) exposure to other viral communicable diseases: Secondary | ICD-10-CM | POA: Diagnosis not present

## 2020-03-30 DIAGNOSIS — F3132 Bipolar disorder, current episode depressed, moderate: Secondary | ICD-10-CM | POA: Diagnosis not present

## 2020-03-30 DIAGNOSIS — F411 Generalized anxiety disorder: Secondary | ICD-10-CM | POA: Diagnosis not present

## 2020-04-13 ENCOUNTER — Ambulatory Visit: Payer: BC Managed Care – PPO | Admitting: Internal Medicine

## 2020-04-15 ENCOUNTER — Ambulatory Visit: Payer: Self-pay | Admitting: Internal Medicine

## 2020-04-17 DIAGNOSIS — Z20822 Contact with and (suspected) exposure to covid-19: Secondary | ICD-10-CM | POA: Diagnosis not present

## 2020-04-25 ENCOUNTER — Ambulatory Visit: Payer: Self-pay | Admitting: Internal Medicine

## 2020-04-29 ENCOUNTER — Ambulatory Visit: Payer: BC Managed Care – PPO | Admitting: Internal Medicine

## 2020-04-29 ENCOUNTER — Encounter: Payer: Self-pay | Admitting: Internal Medicine

## 2020-04-29 ENCOUNTER — Other Ambulatory Visit: Payer: Self-pay

## 2020-04-29 VITALS — BP 136/86 | HR 92 | Temp 98.1°F | Resp 16 | Ht 68.0 in | Wt 167.0 lb

## 2020-04-29 DIAGNOSIS — R739 Hyperglycemia, unspecified: Secondary | ICD-10-CM | POA: Insufficient documentation

## 2020-04-29 DIAGNOSIS — G63 Polyneuropathy in diseases classified elsewhere: Secondary | ICD-10-CM

## 2020-04-29 DIAGNOSIS — R4189 Other symptoms and signs involving cognitive functions and awareness: Secondary | ICD-10-CM | POA: Insufficient documentation

## 2020-04-29 DIAGNOSIS — I10 Essential (primary) hypertension: Secondary | ICD-10-CM

## 2020-04-29 DIAGNOSIS — E538 Deficiency of other specified B group vitamins: Secondary | ICD-10-CM

## 2020-04-29 DIAGNOSIS — R040 Epistaxis: Secondary | ICD-10-CM | POA: Diagnosis not present

## 2020-04-29 DIAGNOSIS — E785 Hyperlipidemia, unspecified: Secondary | ICD-10-CM

## 2020-04-29 NOTE — Progress Notes (Signed)
Subjective:  Patient ID: Colton Andersen, male    DOB: 1961-02-24  Age: 60 y.o. MRN: [id number]  CC: Hypertension  This visit occurred during the SARS-CoV-2 public health emergency.  Safety protocols were in place, including screening questions prior to the visit, additional usage of staff PPE, and extensive cleaning of exam room while observing appropriate contact time as indicated for disinfecting solutions.    HPI Colton Andersen presents for f/up -  He complains of a 32-month history of declining memory that he just describes as intermittent forgetfulness.  He forgets names and previous conversations.  He has chronic low back pain and numbness and tingling in his feet.  He tells me he has had Covid twice in the last month or 2.  He denies headache, blurred vision, depression, anxiety, or insomnia.  He complains of intermittent nosebleeds on the left side.  Outpatient Medications Prior to Visit  Medication Sig Dispense Refill  . Cyanocobalamin (VITAMIN B12 PO) Take 1 capsule by mouth daily.    . diazepam (VALIUM) 10 MG tablet Take 10 mg by mouth daily as needed for anxiety.     Marland Kitchen HYDROcodone-acetaminophen (NORCO/VICODIN) 5-325 MG tablet Take 1 tablet by mouth every 4 (four) hours as needed for moderate pain. 20 tablet 0  . ibuprofen (ADVIL) 800 MG tablet Take 1 tablet (800 mg total) by mouth every 8 (eight) hours as needed. 21 tablet 0  . lamoTRIgine (LAMICTAL) 200 MG tablet Take 200 mg by mouth daily.    . QUEtiapine (SEROQUEL) 400 MG tablet Take 400 mg by mouth at bedtime.   4   No facility-administered medications prior to visit.    ROS Review of Systems  Constitutional: Negative.  Negative for appetite change, diaphoresis, fatigue and unexpected weight change.  HENT: Positive for nosebleeds. Negative for congestion, rhinorrhea, sinus pressure and sneezing.   Eyes: Negative.   Respiratory: Negative for cough, chest tightness, shortness of breath and wheezing.    Gastrointestinal: Negative for abdominal pain, blood in stool, constipation, diarrhea, nausea and vomiting.  Endocrine: Negative.   Genitourinary: Negative.  Negative for difficulty urinating.  Musculoskeletal: Positive for back pain. Negative for myalgias.  Skin: Negative.  Negative for color change and pallor.  Neurological: Positive for numbness. Negative for dizziness and weakness.  Hematological: Negative for adenopathy. Does not bruise/bleed easily.  Psychiatric/Behavioral: Positive for decreased concentration. Negative for confusion, sleep disturbance and suicidal ideas. The patient is not nervous/anxious.     Objective:  BP 136/86   Pulse 92   Temp 98.1 F (36.7 C) (Oral)   Resp 16   Ht 5\' 8"  (1.727 m)   Wt 167 lb (75.8 kg)   SpO2 97%   BMI 25.39 kg/m   BP Readings from Last 3 Encounters:  04/29/20 136/86  05/24/19 (!) 156/92  02/11/19 (!) 144/82    Wt Readings from Last 3 Encounters:  04/29/20 167 lb (75.8 kg)  02/11/19 172 lb (78 kg)  11/05/18 162 lb (73.5 kg)    Physical Exam Vitals reviewed.  Constitutional:      Appearance: Normal appearance.  HENT:     Nose: Nose normal. No mucosal edema, congestion or rhinorrhea.     Left Nostril: No epistaxis.     Mouth/Throat:     Mouth: Mucous membranes are moist.  Eyes:     General: No scleral icterus.    Conjunctiva/sclera: Conjunctivae normal.     Pupils: Pupils are equal, round, and reactive to light.  Cardiovascular:  Rate and Rhythm: Normal rate and regular rhythm.     Heart sounds: No murmur heard.   Pulmonary:     Effort: Pulmonary effort is normal.     Breath sounds: No stridor. No wheezing, rhonchi or rales.  Abdominal:     General: Abdomen is flat.     Palpations: There is no mass.     Tenderness: There is no abdominal tenderness. There is no guarding.     Hernia: No hernia is present.  Musculoskeletal:        General: Normal range of motion.     Cervical back: Neck supple.     Right  lower leg: No edema.     Left lower leg: No edema.  Lymphadenopathy:     Cervical: No cervical adenopathy.  Skin:    General: Skin is warm and dry.     Coloration: Skin is not pale.  Neurological:     General: No focal deficit present.     Mental Status: He is alert and oriented to person, place, and time. Mental status is at baseline.  Psychiatric:        Mood and Affect: Mood normal.        Behavior: Behavior normal.     Lab Results  Component Value Date   WBC 7.1 02/11/2019   HGB 13.7 02/11/2019   HCT 40.5 02/11/2019   PLT 229.0 02/11/2019   GLUCOSE 114 (H) 02/11/2019   CHOL 181 02/11/2019   TRIG 197.0 (H) 02/11/2019   HDL 36.40 (L) 02/11/2019   LDLDIRECT 153.2 11/08/2008   LDLCALC 105 (H) 02/11/2019   ALT 17 02/11/2019   AST 15 02/11/2019   NA 141 02/11/2019   K 4.5 02/11/2019   CL 103 02/11/2019   CREATININE 1.24 02/11/2019   BUN 13 02/11/2019   CO2 31 02/11/2019   TSH 1.14 02/11/2019   PSA 1.90 02/11/2019   INR 1.0 03/29/2014   HGBA1C 5.5 06/28/2008    DG Shoulder Right  Result Date: 05/24/2019 CLINICAL DATA:  Right shoulder pain. History of rotator cuff surgery. EXAM: RIGHT SHOULDER - 2+ VIEW COMPARISON:  None. FINDINGS: No acute fracture. No evidence for shoulder separation or dislocation. There is some trace degenerative spurring in the inferior glenoid. Tiny intra-articular mineralized loose body possible over the glenohumeral joint. IMPRESSION: 1. No acute bony abnormality. 2. Degenerative changes in the inferior glenoid. 3. Question tiny intra-articular loose body. Electronically Signed   By: Misty Stanley M.D.   On: 05/24/2019 10:52    Assessment & Plan:   Colton Andersen was seen today for hypertension.  Diagnoses and all orders for this visit:  Cognitive decline- I will screen him for causes of cognitive decline. -     Cancel: RPR; Future -     Cancel: Vitamin B1; Future -     Vitamin B1; Future -     RPR; Future -     RPR -     Vitamin B1  Vitamin  B12 deficiency neuropathy ([place])- I will monitor his H&H, B12, and folate levels. -     Cancel: CBC with Differential/Platelet; Future -     Cancel: Vitamin B12; Future -     Cancel: Folate; Future -     Folate; Future -     Vitamin B12; Future -     CBC with Differential/Platelet; Future -     CBC with Differential/Platelet -     Vitamin B12 -     Folate  Essential hypertension- His  blood pressure is adequately well controlled. -     Cancel: Basic metabolic panel; Future -     Cancel: Hepatic function panel; Future -     Cancel: TSH; Future -     TSH; Future -     Hepatic function panel; Future -     Basic metabolic panel; Future -     Basic metabolic panel -     Hepatic function panel -     TSH  Left-sided epistaxis- I do not see any bleeding today.  I will check him for coagulopathies.  If the bleeding persists then will consider doing a CT scan of the sinuses. -     Cancel: CBC with Differential/Platelet; Future -     Cancel: Vitamin B12; Future -     Cancel: Folate; Future -     Cancel: Protime-INR; Future -     Cancel: APTT; Future -     APTT; Future -     Protime-INR; Future -     Folate; Future -     Vitamin B12; Future -     CBC with Differential/Platelet; Future -     CBC with Differential/Platelet -     Vitamin B12 -     Folate -     Protime-INR -     APTT  Hyperglycemia -     Cancel: Hemoglobin A1c; Future -     Hemoglobin A1c; Future -     Hemoglobin A1c   I am having Maureen Chatters maintain his lamoTRIgine, diazepam, QUEtiapine, Cyanocobalamin (VITAMIN B12 PO), ibuprofen, and HYDROcodone-acetaminophen.  No orders of the defined types were placed in this encounter.    Follow-up: Return in about 4 weeks (around 05/27/2020).  Scarlette Calico, MD

## 2020-04-29 NOTE — Patient Instructions (Signed)
Nosebleed, Adult A nosebleed is when blood comes out of the nose. Nosebleeds are common. Usually, they are not a sign of a serious condition. Nosebleeds can happen if a blood vessel in your nose starts to bleed or if the lining of your nose (mucous membrane) cracks. They are commonly caused by:  Allergies.  Colds.  Picking your nose.  Blowing your nose too hard.  An injury from sticking an object into your nose or getting hit in the nose.  Dry or cold air. Less common causes of nosebleeds include:  Toxic fumes.  Something abnormal in the nose or in the air-filled spaces in the bones of the face (sinuses).  Growths in the nose, such as polyps.  Blood thinners or conditions that cause blood to clot slowly.  Certain illnesses or procedures that irritate or dry out the nasal passages. Follow these instructions at home: When you have a nosebleed:  Sit down and tilt your head slightly forward.  Use a clean towel or tissue to pinch your nostrils under the bony part of your nose. After 5 minutes, let go of your nose and see if bleeding starts again. Do not release pressure before that time. If there is still bleeding, repeat the pinching and holding for 5 minutes or until the bleeding stops.  Do not place tissues or gauze in the nose to stop the bleeding.  Avoid lying down and avoid tilting your head backward. That may make blood collect in the throat and cause gagging or coughing.  Use a nasal spray decongestant to help with a nosebleed as told by your health care provider.   After a nosebleed:  Avoid blowing your nose or sniffing for a number of hours.  Avoid straining, lifting, or bending at the waist for several days. You may go back to other normal activities as you are able.  If you are taking aspirin or blood thinners and you have nosebleeds, talk to your health care provider. These medicines make bleeding more likely. ? Ask your health care provider if you should stop  taking the medicines or if you should adjust the dose. ? Do not stop taking medicines that your health care provider has recommended unless he or she tells you to stop taking them.  If your nosebleed was caused by dry mucous membranes, use over-the-counter saline nasal spray or gel and a humidifier as told by your health care provider. This will keep the mucous membranes moist and allow them to heal. If you need to use one of these products: ? Choose one that is water-soluble. ? Use only as much as you need and use it only as often as needed. ? Do not lie down right after you use it.  If you get nosebleeds often, talk with your health care provider about medical treatments. Options may include: ? Nasal cautery. This treatment stops and prevents nosebleeds by using a chemical swab or electrical device to lightly burn tiny blood vessels inside the nose. ? Nasal packing. A gauze or other material is placed in the nose to keep constant pressure on the bleeding area. Contact a health care provider if you:  Have a fever.  Get nosebleeds often or more often than usual.  Bruise very easily.  Have a nosebleed from having something stuck in your nose.  Have bleeding in your mouth.  Vomit or cough up brown material.  Have a nosebleed after you start a new medicine. Get help right away if:  You have a   nosebleed after a fall or a head injury.  Your nosebleed does not go away after 20 minutes.  You feel dizzy or weak.  You have unusual bleeding from other parts of your body.  You have unusual bruising on other parts of your body.  You become sweaty.  You vomit blood. Summary  A nosebleed is when blood comes out of the nose. Common causes include allergies, an injury to the nose, or cold or dry air.  Initial treatment includes applying pressure for 5 minutes.  Moisturizing the nose with saline nasal spray or gel after a nosebleed may help prevent future bleeding.  Get help right  away if your nosebleed does not go away after 20 minutes. This information is not intended to replace advice given to you by your health care provider. Make sure you discuss any questions you have with your health care provider. Document Revised: 01/08/2019 Document Reviewed: 01/08/2019 Elsevier Patient Education  2021 [employer]  

## 2020-05-05 ENCOUNTER — Encounter: Payer: Self-pay | Admitting: Internal Medicine

## 2020-05-05 LAB — CBC WITH DIFFERENTIAL/PLATELET
Absolute Monocytes: 520 cells/uL (ref 200–950)
Basophils Absolute: 56 cells/uL (ref 0–200)
Basophils Relative: 0.7 %
Eosinophils Absolute: 136 cells/uL (ref 15–500)
Eosinophils Relative: 1.7 %
HCT: 40.4 % (ref 38.5–50.0)
Hemoglobin: 13.9 g/dL (ref 13.2–17.1)
Lymphs Abs: 2488 cells/uL (ref 850–3900)
MCH: 29.8 pg (ref 27.0–33.0)
MCHC: 34.4 g/dL (ref 32.0–36.0)
MCV: 86.7 fL (ref 80.0–100.0)
MPV: 11.2 fL (ref 7.5–12.5)
Monocytes Relative: 6.5 %
Neutro Abs: 4800 cells/uL (ref 1500–7800)
Neutrophils Relative %: 60 %
Platelets: 266 10*3/uL (ref 140–400)
RBC: 4.66 10*6/uL (ref 4.20–5.80)
RDW: 13.5 % (ref 11.0–15.0)
Total Lymphocyte: 31.1 %
WBC: 8 10*3/uL (ref 3.8–10.8)

## 2020-05-05 LAB — HEMOGLOBIN A1C
Hgb A1c MFr Bld: 5.7 % of total Hgb — ABNORMAL HIGH (ref <5.7)
Mean Plasma Glucose: 117 mg/dL
eAG (mmol/L): 6.5 mmol/L

## 2020-05-05 LAB — HEPATIC FUNCTION PANEL
AG Ratio: 1.7 (calc) (ref 1.0–2.5)
ALT: 17 U/L (ref 9–46)
AST: 15 U/L (ref 10–35)
Albumin: 4.4 g/dL (ref 3.6–5.1)
Alkaline phosphatase (APISO): 21 U/L — ABNORMAL LOW (ref 35–144)
Bilirubin, Direct: 0.1 mg/dL (ref 0.0–0.2)
Globulin: 2.6 g/dL (calc) (ref 1.9–3.7)
Indirect Bilirubin: 0.3 mg/dL (calc) (ref 0.2–1.2)
Total Bilirubin: 0.4 mg/dL (ref 0.2–1.2)
Total Protein: 7 g/dL (ref 6.1–8.1)

## 2020-05-05 LAB — FOLATE: Folate: >24 ng/mL

## 2020-05-05 LAB — RPR: RPR Ser Ql: NONREACTIVE

## 2020-05-05 LAB — BASIC METABOLIC PANEL
BUN: 14 mg/dL (ref 7–25)
CO2: 28 mmol/L (ref 20–32)
Calcium: 10.1 mg/dL (ref 8.6–10.3)
Chloride: 105 mmol/L (ref 98–110)
Creat: 1.17 mg/dL (ref 0.70–1.33)
Glucose, Bld: 112 mg/dL — ABNORMAL HIGH (ref 65–99)
Potassium: 3.9 mmol/L (ref 3.5–5.3)
Sodium: 143 mmol/L (ref 135–146)

## 2020-05-05 LAB — TSH: TSH: 1.43 mIU/L (ref 0.40–4.50)

## 2020-05-05 LAB — PROTIME-INR
INR: 1
Prothrombin Time: 9.8 s (ref 9.0–11.5)

## 2020-05-05 LAB — VITAMIN B12: Vitamin B-12: 1722 pg/mL — ABNORMAL HIGH (ref 200–1100)

## 2020-05-05 LAB — VITAMIN B1: Vitamin B1 (Thiamine): 43 nmol/L — ABNORMAL HIGH (ref 8–30)

## 2020-05-05 LAB — APTT: aPTT: 28 s (ref 23–32)

## 2020-06-28 DIAGNOSIS — F411 Generalized anxiety disorder: Secondary | ICD-10-CM | POA: Diagnosis not present

## 2020-06-28 DIAGNOSIS — F3132 Bipolar disorder, current episode depressed, moderate: Secondary | ICD-10-CM | POA: Diagnosis not present

## 2020-09-15 DIAGNOSIS — Z20828 Contact with and (suspected) exposure to other viral communicable diseases: Secondary | ICD-10-CM | POA: Diagnosis not present

## 2020-09-15 DIAGNOSIS — J209 Acute bronchitis, unspecified: Secondary | ICD-10-CM | POA: Diagnosis not present

## 2020-09-15 DIAGNOSIS — R052 Subacute cough: Secondary | ICD-10-CM | POA: Diagnosis not present

## 2020-09-15 DIAGNOSIS — R519 Headache, unspecified: Secondary | ICD-10-CM | POA: Diagnosis not present

## 2020-09-15 DIAGNOSIS — R051 Acute cough: Secondary | ICD-10-CM | POA: Diagnosis not present

## 2020-09-16 DIAGNOSIS — M7541 Impingement syndrome of right shoulder: Secondary | ICD-10-CM | POA: Diagnosis not present

## 2020-09-16 DIAGNOSIS — M7542 Impingement syndrome of left shoulder: Secondary | ICD-10-CM | POA: Diagnosis not present

## 2020-10-04 DIAGNOSIS — F3132 Bipolar disorder, current episode depressed, moderate: Secondary | ICD-10-CM | POA: Diagnosis not present

## 2020-10-04 DIAGNOSIS — F411 Generalized anxiety disorder: Secondary | ICD-10-CM | POA: Diagnosis not present

## 2020-10-12 ENCOUNTER — Encounter: Payer: Self-pay | Admitting: Internal Medicine

## 2020-10-12 ENCOUNTER — Other Ambulatory Visit: Payer: Self-pay

## 2020-10-12 ENCOUNTER — Ambulatory Visit (INDEPENDENT_AMBULATORY_CARE_PROVIDER_SITE_OTHER): Payer: BC Managed Care – PPO | Admitting: Internal Medicine

## 2020-10-12 VITALS — BP 136/78 | HR 82 | Temp 98.6°F | Ht 68.0 in | Wt 163.4 lb

## 2020-10-12 DIAGNOSIS — Z0000 Encounter for general adult medical examination without abnormal findings: Secondary | ICD-10-CM

## 2020-10-12 DIAGNOSIS — E538 Deficiency of other specified B group vitamins: Secondary | ICD-10-CM

## 2020-10-12 DIAGNOSIS — R739 Hyperglycemia, unspecified: Secondary | ICD-10-CM

## 2020-10-12 DIAGNOSIS — R0600 Dyspnea, unspecified: Secondary | ICD-10-CM | POA: Diagnosis not present

## 2020-10-12 DIAGNOSIS — E785 Hyperlipidemia, unspecified: Secondary | ICD-10-CM | POA: Diagnosis not present

## 2020-10-12 DIAGNOSIS — G63 Polyneuropathy in diseases classified elsewhere: Secondary | ICD-10-CM

## 2020-10-12 DIAGNOSIS — R0609 Other forms of dyspnea: Secondary | ICD-10-CM

## 2020-10-12 DIAGNOSIS — I10 Essential (primary) hypertension: Secondary | ICD-10-CM

## 2020-10-12 DIAGNOSIS — K635 Polyp of colon: Secondary | ICD-10-CM

## 2020-10-12 DIAGNOSIS — K219 Gastro-esophageal reflux disease without esophagitis: Secondary | ICD-10-CM

## 2020-10-12 NOTE — Progress Notes (Signed)
O   Subjective:  Patient ID: Colton Andersen, male    DOB: 1960/11/23  Age: 60 y.o. MRN: [id number]  CC: Annual Exam  This visit occurred during the SARS-CoV-2 public health emergency.  Safety protocols were in place, including screening questions prior to the visit, additional usage of staff PPE, and extensive cleaning of exam room while observing appropriate contact time as indicated for disinfecting solutions.    HPI Colton Andersen presents for a CPX and f/up -   He complains of a several month history of substernal chest discomfort that he describes as heartburn.  It only occurs at rest and gets better after a dose of Rolaids.  He is very active and has mild DOE but denies dizziness, lightheadedness, palpitations, edema, or fatigue.  Outpatient Medications Prior to Visit  Medication Sig Dispense Refill   Cyanocobalamin (VITAMIN B12 PO) Take 1 capsule by mouth daily.     diazepam (VALIUM) 10 MG tablet Take 10 mg by mouth daily as needed for anxiety.      ibuprofen (ADVIL) 800 MG tablet Take 1 tablet (800 mg total) by mouth every 8 (eight) hours as needed. 21 tablet 0   lamoTRIgine (LAMICTAL) 200 MG tablet Take 200 mg by mouth daily.     QUEtiapine (SEROQUEL) 400 MG tablet Take 400 mg by mouth at bedtime.   4   HYDROcodone-acetaminophen (NORCO/VICODIN) 5-325 MG tablet Take 1 tablet by mouth every 4 (four) hours as needed for moderate pain. 20 tablet 0   No facility-administered medications prior to visit.    ROS Review of Systems  Constitutional:  Negative for diaphoresis, fatigue and unexpected weight change.  HENT: Negative.  Negative for trouble swallowing and voice change.   Respiratory:  Negative for cough, chest tightness, shortness of breath and wheezing.   Cardiovascular:  Positive for chest pain. Negative for palpitations and leg swelling.  Gastrointestinal:  Negative for abdominal pain, constipation, diarrhea, nausea and vomiting.  Endocrine: Negative.   Genitourinary:  Negative.  Negative for difficulty urinating, scrotal swelling and testicular pain.  Musculoskeletal:  Negative for arthralgias, back pain, myalgias and neck pain.  Skin: Negative.  Negative for color change and pallor.  Allergic/Immunologic: Negative.   Neurological: Negative.  Negative for dizziness.  Hematological:  Negative for adenopathy. Does not bruise/bleed easily.  Psychiatric/Behavioral: Negative.     Objective:  BP 136/78 (BP Location: Right Arm, Patient Position: Sitting, Cuff Size: Large)   Pulse 82   Temp 98.6 F (37 C) (Oral)   Ht 5\' 8"  (1.727 m)   Wt 163 lb 6.4 oz (74.1 kg)   SpO2 97%   BMI 24.84 kg/m   BP Readings from Last 3 Encounters:  10/12/20 136/78  04/29/20 136/86  05/24/19 (!) 156/92    Wt Readings from Last 3 Encounters:  10/12/20 163 lb 6.4 oz (74.1 kg)  04/29/20 167 lb (75.8 kg)  02/11/19 172 lb (78 kg)    Physical Exam Vitals reviewed.  Constitutional:      Appearance: Normal appearance.  HENT:     Nose: Nose normal.     Mouth/Throat:     Mouth: Mucous membranes are moist.  Eyes:     Conjunctiva/sclera: Conjunctivae normal.  Cardiovascular:     Rate and Rhythm: Normal rate and regular rhythm.     Heart sounds: Normal heart sounds, S1 normal and S2 normal. No murmur heard.   No gallop.     Comments: EKG- NSR, 82 bpm Normal EKG Pulmonary:  Effort: Pulmonary effort is normal.     Breath sounds: No stridor. No wheezing, rhonchi or rales.  Abdominal:     General: Abdomen is flat. Bowel sounds are normal. There is no distension.     Palpations: Abdomen is soft. There is no fluid wave, hepatomegaly, splenomegaly or mass.     Tenderness: There is no abdominal tenderness. There is no guarding.  Genitourinary:    Pubic Area: No rash.      Penis: Normal and circumcised.      Testes: Normal.     Epididymis:     Right: Normal.     Left: Normal.     Prostate: Normal. Not enlarged, not tender and no nodules present.     Rectum: Normal.  Guaiac result negative. No mass, tenderness, anal fissure, external hemorrhoid or internal hemorrhoid. Normal anal tone.  Musculoskeletal:     Cervical back: Neck supple.     Right lower leg: No edema.     Left lower leg: No edema.  Lymphadenopathy:     Cervical: No cervical adenopathy.  Skin:    General: Skin is warm and dry.     Coloration: Skin is not pale.  Neurological:     General: No focal deficit present.     Mental Status: He is alert and oriented to person, place, and time. Mental status is at baseline.  Psychiatric:        Mood and Affect: Mood normal.        Behavior: Behavior normal.    Lab Results  Component Value Date   WBC 7.2 10/12/2020   HGB 14.4 10/12/2020   HCT 43.0 10/12/2020   PLT 220.0 10/12/2020   GLUCOSE 100 (H) 10/12/2020   CHOL 213 (H) 10/12/2020   TRIG 121.0 10/12/2020   HDL 60.10 10/12/2020   LDLDIRECT 153.2 11/08/2008   LDLCALC 129 (H) 10/12/2020   ALT 23 10/12/2020   AST 15 10/12/2020   NA 141 10/12/2020   K 5.3 (H) 10/12/2020   CL 102 10/12/2020   CREATININE 1.27 10/12/2020   BUN 16 10/12/2020   CO2 30 10/12/2020   TSH 1.04 10/12/2020   PSA 1.93 10/12/2020   INR 1.0 04/29/2020   HGBA1C 5.8 10/12/2020    DG Shoulder Right  Result Date: 05/24/2019 CLINICAL DATA:  Right shoulder pain. History of rotator cuff surgery. EXAM: RIGHT SHOULDER - 2+ VIEW COMPARISON:  None. FINDINGS: No acute fracture. No evidence for shoulder separation or dislocation. There is some trace degenerative spurring in the inferior glenoid. Tiny intra-articular mineralized loose body possible over the glenohumeral joint. IMPRESSION: 1. No acute bony abnormality. 2. Degenerative changes in the inferior glenoid. 3. Question tiny intra-articular loose body. Electronically Signed   By: Misty Stanley M.D.   On: 05/24/2019 10:52    Assessment & Plan:   [name] was seen today for annual exam.  Diagnoses and all orders for this visit:  Essential hypertension- His BP is  well controlled. 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 -     CBC with Differential/Platelet; Future -     Vitamin B12; Future -     Folate; Future -  Folate -     Vitamin B12 -     CBC with Differential/Platelet  Routine general medical examination at a health care facility- Exam completed, labs reviewed, vaccines reviewed, cancer screenings addressed, patient education was given. -     PSA; Future -     Lipid panel; Future -     Lipid panel -     PSA  Hyperglycemia- His A1c is normal. -     Hemoglobin A1c; Future -     Hemoglobin A1c  Hyperlipidemia LDL goal <130- He does not have an elevated ASCVD risk score so I did not recommend a statin for CV risk reduction. -     Cancel: Lipid panel; Future -     TSH; Future -     Hepatic function panel; Future -     Hepatic function panel -     TSH  DOE (dyspnea on exertion)- His EKG and labs are reassuring.  Will screen for coronary atherosclerosis with a CT calcium score. -     EKG 12-Lead -     Brain natriuretic peptide; Future -     Troponin I (High Sensitivity); Future -     Troponin I (High Sensitivity) -     Brain natriuretic peptide -     CT CARDIAC SCORING (SELF PAY ONLY); Future  Gastroesophageal reflux disease without esophagitis- He is getting adequate symptom relief with Rolaids.  Polyp of colon, unspecified part of colon, unspecified type -     Ambulatory referral to Gastroenterology  I have discontinued Hinda Lenis. Nellums's HYDROcodone-acetaminophen. I am also having him maintain his lamoTRIgine, diazepam, QUEtiapine, Cyanocobalamin (VITAMIN B12 PO), and ibuprofen.  No orders of the defined types were placed in this encounter.    Follow-up: Return in about 6 months  (around 04/14/2021).  Scarlette Calico, MD

## 2020-10-12 NOTE — Patient Instructions (Signed)
Health Maintenance, Male Adopting a healthy lifestyle and getting preventive care are important in promoting health and wellness. Ask your health care provider about: The right schedule for you to have regular tests and exams. Things you can do on your own to prevent diseases and keep yourself healthy. What should I know about diet, weight, and exercise? Eat a healthy diet  Eat a diet that includes plenty of vegetables, fruits, low-fat dairy products, and lean protein. Do not eat a lot of foods that are high in solid fats, added sugars, or sodium.  Maintain a healthy weight Body mass index (BMI) is a measurement that can be used to identify possible weight problems. It estimates body fat based on height and weight. Your health care provider can help determine your BMI and help you achieve or maintain ahealthy weight. Get regular exercise Get regular exercise. This is one of the most important things you can do for your health. Most adults should: Exercise for at least 150 minutes each week. The exercise should increase your heart rate and make you sweat (moderate-intensity exercise). Do strengthening exercises at least twice a week. This is in addition to the moderate-intensity exercise. Spend less time sitting. Even light physical activity can be beneficial. Watch cholesterol and blood lipids Have your blood tested for lipids and cholesterol at 60 years of age, then havethis test every 5 years. You may need to have your cholesterol levels checked more often if: Your lipid or cholesterol levels are high. You are older than 60 years of age. You are at high risk for heart disease. What should I know about cancer screening? Many types of cancers can be detected early and may often be prevented. Depending on your health history and family history, you may need to have cancer screening at various ages. This may include screening for: Colorectal cancer. Prostate cancer. Skin cancer. Lung  cancer. What should I know about heart disease, diabetes, and high blood pressure? Blood pressure and heart disease High blood pressure causes heart disease and increases the risk of stroke. This is more likely to develop in people who have high blood pressure readings, are of African descent, or are overweight. Talk with your health care provider about your target blood pressure readings. Have your blood pressure checked: Every 3-5 years if you are 18-39 years of age. Every year if you are 40 years old or older. If you are between the ages of 65 and 75 and are a current or former smoker, ask your health care provider if you should have a one-time screening for abdominal aortic aneurysm (AAA). Diabetes Have regular diabetes screenings. This checks your fasting blood sugar level. Have the screening done: Once every three years after age 45 if you are at a normal weight and have a low risk for diabetes. More often and at a younger age if you are overweight or have a high risk for diabetes. What should I know about preventing infection? Hepatitis B If you have a higher risk for hepatitis B, you should be screened for this virus. Talk with your health care provider to find out if you are at risk forhepatitis B infection. Hepatitis C Blood testing is recommended for: Everyone born from 1945 through 1965. Anyone with known risk factors for hepatitis C. Sexually transmitted infections (STIs) You should be screened each year for STIs, including gonorrhea and chlamydia, if: You are sexually active and are younger than 60 years of age. You are older than 60 years of age   and your health care provider tells you that you are at risk for this type of infection. Your sexual activity has changed since you were last screened, and you are at increased risk for chlamydia or gonorrhea. Ask your health care provider if you are at risk. Ask your health care provider about whether you are at high risk for HIV.  Your health care provider may recommend a prescription medicine to help prevent HIV infection. If you choose to take medicine to prevent HIV, you should first get tested for HIV. You should then be tested every 3 months for as long as you are taking the medicine. Follow these instructions at home: Lifestyle Do not use any products that contain nicotine or tobacco, such as cigarettes, e-cigarettes, and chewing tobacco. If you need help quitting, ask your health care provider. Do not use street drugs. Do not share needles. Ask your health care provider for help if you need support or information about quitting drugs. Alcohol use Do not drink alcohol if your health care provider tells you not to drink. If you drink alcohol: Limit how much you have to 0-2 drinks a day. Be aware of how much alcohol is in your drink. In the [place] one drink equals one 12 oz bottle of beer (355 mL), one 5 oz glass of wine (148 mL), or one 1 oz glass of hard liquor (44 mL). General instructions Schedule regular health, dental, and eye exams. Stay current with your vaccines. Tell your health care provider if: You often feel depressed. You have ever been abused or do not feel safe at home. Summary Adopting a healthy lifestyle and getting preventive care are important in promoting health and wellness. Follow your health care provider's instructions about healthy diet, exercising, and getting tested or screened for diseases. Follow your health care provider's instructions on monitoring your cholesterol and blood pressure. This information is not intended to replace advice given to you by your health care provider. Make sure you discuss any questions you have with your healthcare provider. Document Revised: 03/05/2018 Document Reviewed: 03/05/2018 Elsevier Patient Education  2022 Elsevier Inc.  

## 2020-10-13 LAB — CBC WITH DIFFERENTIAL/PLATELET
Basophils Absolute: 0.1 10*3/uL (ref 0.0–0.1)
Basophils Relative: 0.8 % (ref 0.0–3.0)
Eosinophils Absolute: 0.2 10*3/uL (ref 0.0–0.7)
Eosinophils Relative: 2.8 % (ref 0.0–5.0)
HCT: 43 % (ref 39.0–52.0)
Hemoglobin: 14.4 g/dL (ref 13.0–17.0)
Lymphocytes Relative: 38.6 % (ref 12.0–46.0)
Lymphs Abs: 2.8 10*3/uL (ref 0.7–4.0)
MCHC: 33.6 g/dL (ref 30.0–36.0)
MCV: 90.6 fl (ref 78.0–100.0)
Monocytes Absolute: 0.5 10*3/uL (ref 0.1–1.0)
Monocytes Relative: 7.4 % (ref 3.0–12.0)
Neutro Abs: 3.6 10*3/uL (ref 1.4–7.7)
Neutrophils Relative %: 50.4 % (ref 43.0–77.0)
Platelets: 220 10*3/uL (ref 150.0–400.0)
RBC: 4.74 Mil/uL (ref 4.22–5.81)
RDW: 14.5 % (ref 11.5–15.5)
WBC: 7.2 10*3/uL (ref 4.0–10.5)

## 2020-10-13 LAB — LIPID PANEL
Cholesterol: 213 mg/dL — ABNORMAL HIGH (ref 0–200)
HDL: 60.1 mg/dL (ref >39.00)
LDL Cholesterol: 129 mg/dL — ABNORMAL HIGH (ref 0–99)
NonHDL: 152.84
Total CHOL/HDL Ratio: 4
Triglycerides: 121 mg/dL (ref 0.0–149.0)
VLDL: 24.2 mg/dL (ref 0.0–40.0)

## 2020-10-13 LAB — PSA: PSA: 1.93 ng/mL (ref 0.10–4.00)

## 2020-10-13 LAB — HEPATIC FUNCTION PANEL
ALT: 23 U/L (ref 0–53)
AST: 15 U/L (ref 0–37)
Albumin: 4.6 g/dL (ref 3.5–5.2)
Alkaline Phosphatase: 21 U/L — ABNORMAL LOW (ref 39–117)
Bilirubin, Direct: 0.1 mg/dL (ref 0.0–0.3)
Total Bilirubin: 0.4 mg/dL (ref 0.2–1.2)
Total Protein: 7.3 g/dL (ref 6.0–8.3)

## 2020-10-13 LAB — BRAIN NATRIURETIC PEPTIDE: Pro B Natriuretic peptide (BNP): 9 pg/mL (ref 0.0–100.0)

## 2020-10-13 LAB — BASIC METABOLIC PANEL
BUN: 16 mg/dL (ref 6–23)
CO2: 30 mEq/L (ref 19–32)
Calcium: 10.3 mg/dL (ref 8.4–10.5)
Chloride: 102 mEq/L (ref 96–112)
Creatinine, Ser: 1.27 mg/dL (ref 0.40–1.50)
GFR: 61.45 mL/min (ref >60.00)
Glucose, Bld: 100 mg/dL — ABNORMAL HIGH (ref 70–99)
Potassium: 5.3 mEq/L — ABNORMAL HIGH (ref 3.5–5.1)
Sodium: 141 mEq/L (ref 135–145)

## 2020-10-13 LAB — FOLATE: Folate: >24.4 ng/mL (ref >5.9)

## 2020-10-13 LAB — VITAMIN B12: Vitamin B-12: 761 pg/mL (ref 211–911)

## 2020-10-13 LAB — HEMOGLOBIN A1C: Hgb A1c MFr Bld: 5.8 % (ref 4.6–6.5)

## 2020-10-13 LAB — TROPONIN I (HIGH SENSITIVITY): High Sens Troponin I: 3 ng/L (ref 2–17)

## 2020-10-13 LAB — TSH: TSH: 1.04 u[IU]/mL (ref 0.35–5.50)

## 2020-10-16 ENCOUNTER — Encounter: Payer: Self-pay | Admitting: Internal Medicine

## 2020-11-04 ENCOUNTER — Encounter: Payer: Self-pay | Admitting: Internal Medicine

## 2020-12-08 ENCOUNTER — Other Ambulatory Visit: Payer: Self-pay

## 2020-12-08 ENCOUNTER — Ambulatory Visit (INDEPENDENT_AMBULATORY_CARE_PROVIDER_SITE_OTHER)
Admission: RE | Admit: 2020-12-08 | Discharge: 2020-12-08 | Disposition: A | Discharge status: Home / Self Care | Payer: Self-pay | Source: Ambulatory Visit | Attending: Internal Medicine | Admitting: Internal Medicine

## 2020-12-08 DIAGNOSIS — R0609 Other forms of dyspnea: Secondary | ICD-10-CM

## 2020-12-08 DIAGNOSIS — R0600 Dyspnea, unspecified: Secondary | ICD-10-CM

## 2020-12-10 ENCOUNTER — Encounter: Payer: Self-pay | Admitting: Internal Medicine

## 2021-02-20 IMAGING — CR DG SHOULDER 2+V*R*
3 series · 3 of 3 positions shown · non-contrast
Comparison: None.

CLINICAL DATA: Right shoulder pain. History of rotator cuff
surgery.

EXAM:
RIGHT SHOULDER - 2+ VIEW

[w shoulder external right]
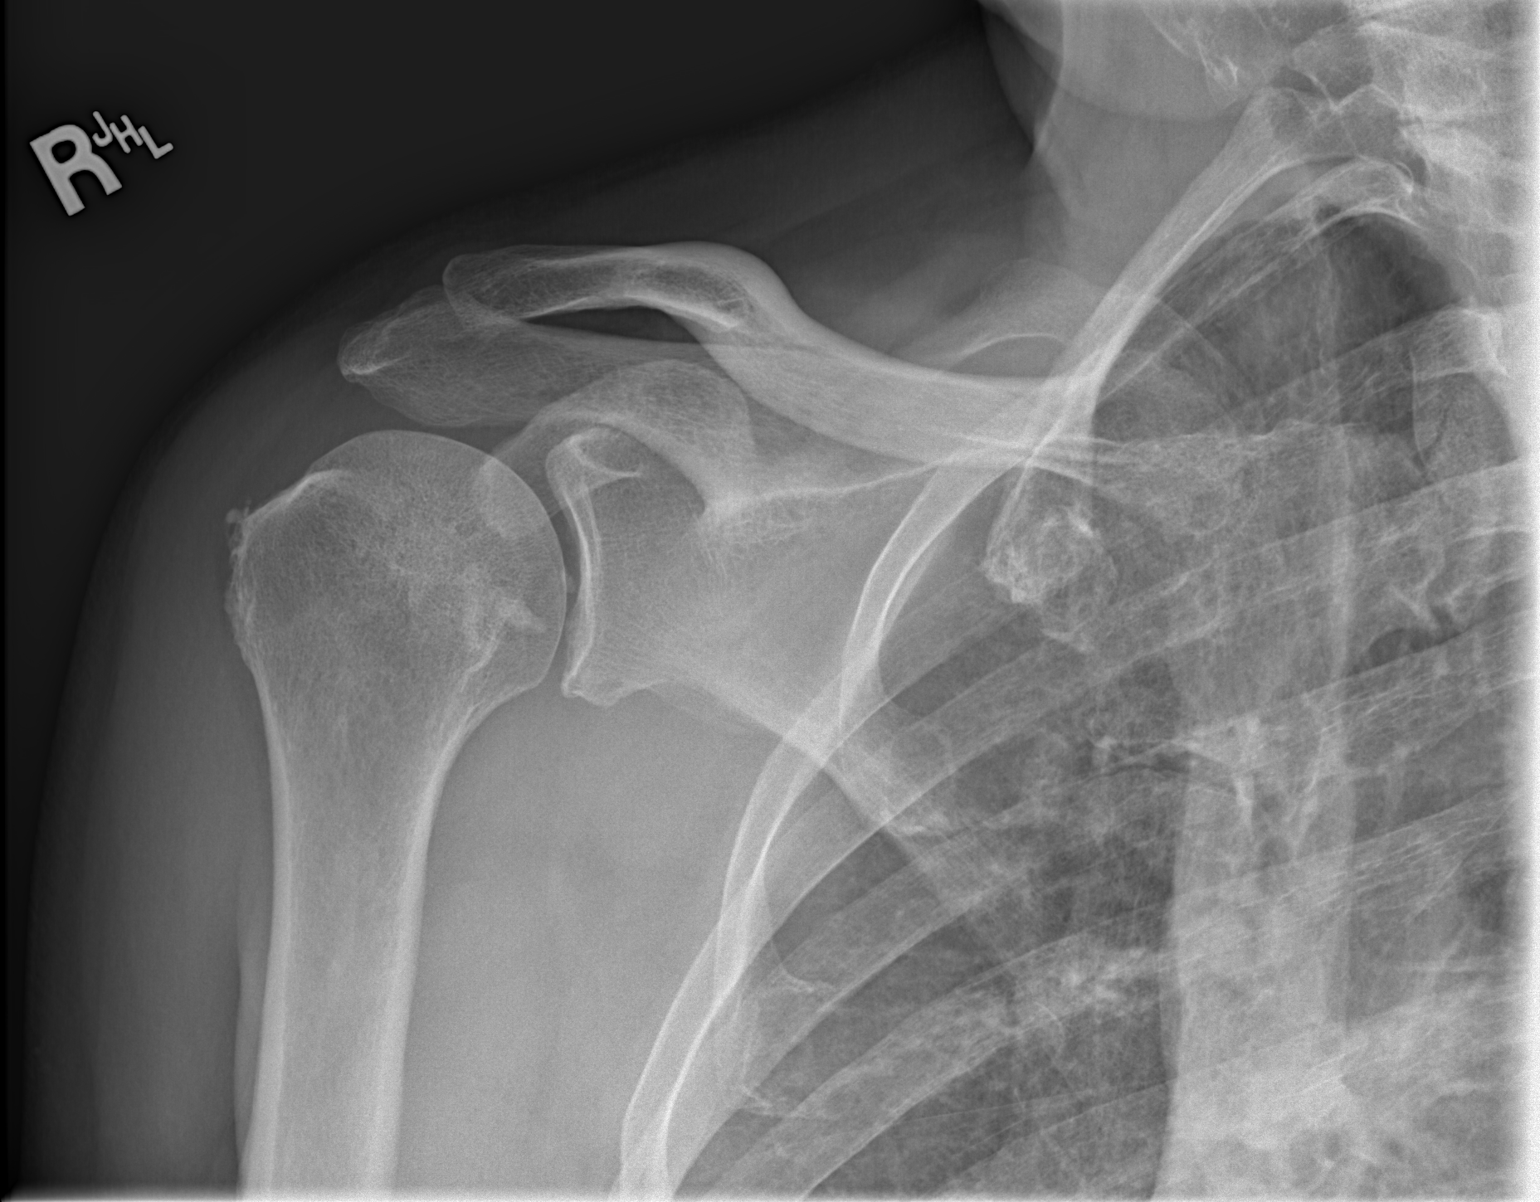

[w shoulder y-view right]
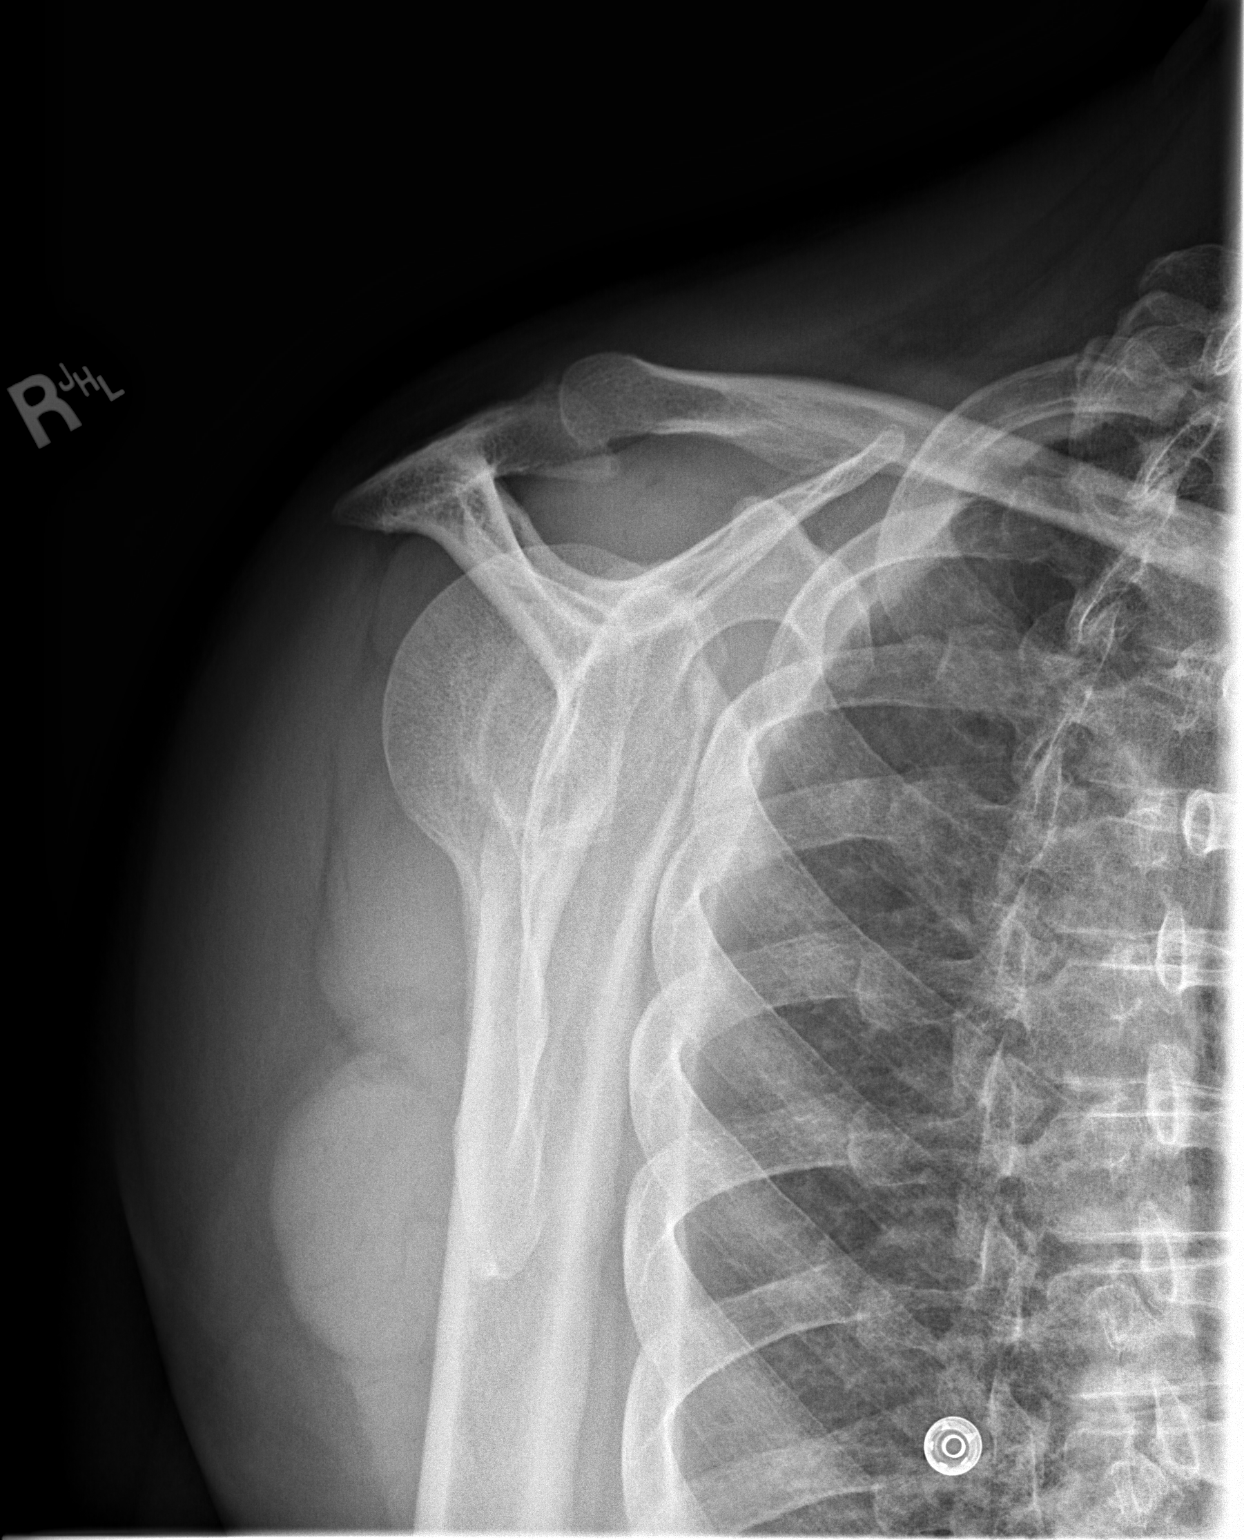

[x shoulder axillary right]
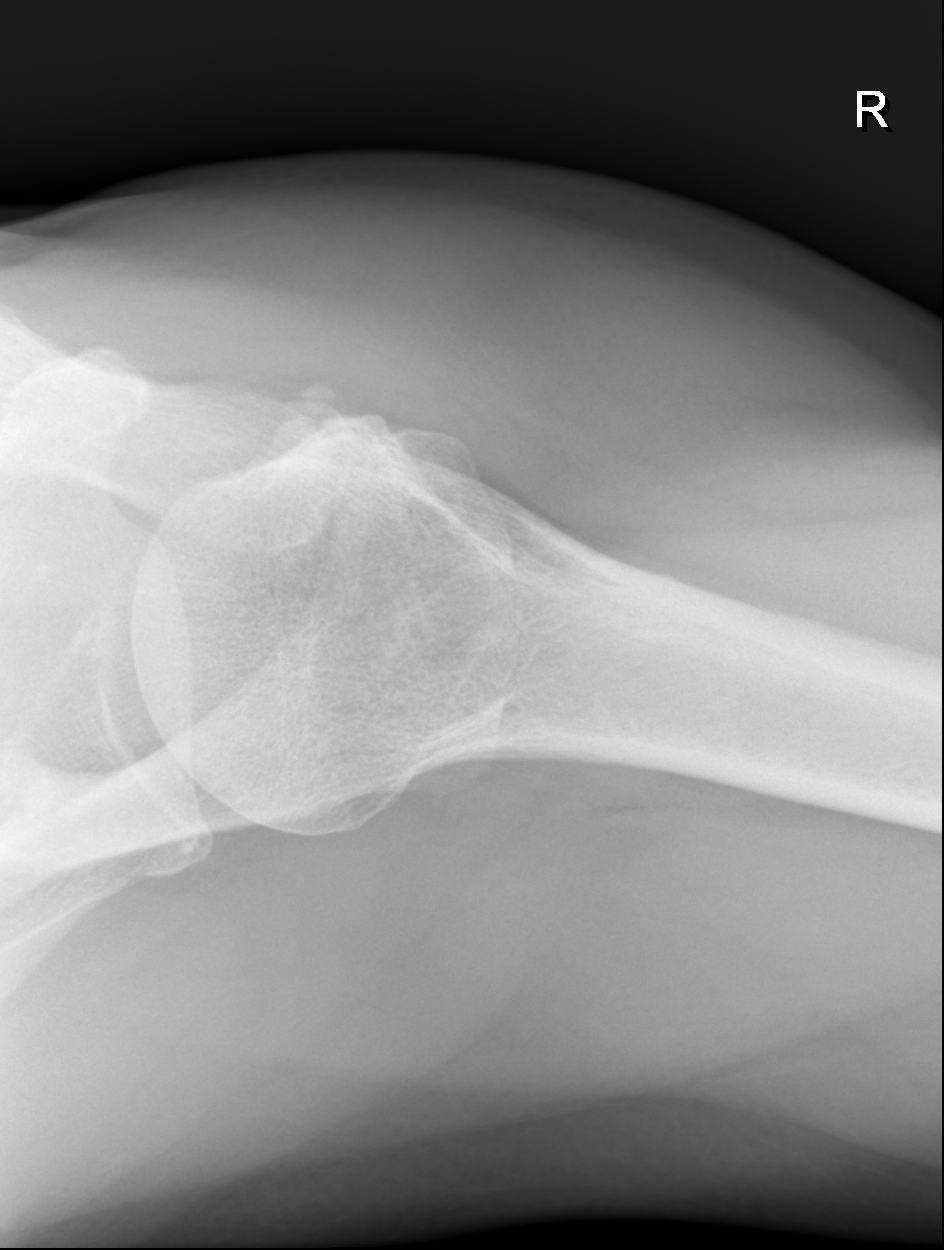

[3 of 3 positions shown; findings below may reference images not displayed]

FINDINGS: No acute fracture. No evidence for shoulder separation or
dislocation. There is some trace degenerative spurring in the
inferior glenoid. Tiny intra-articular mineralized loose body
possible over the glenohumeral joint.
IMPRESSION: 1. No acute bony abnormality.
2. Degenerative changes in the inferior glenoid.
3. Question tiny intra-articular loose body.

## 2021-03-24 DIAGNOSIS — L02414 Cutaneous abscess of left upper limb: Secondary | ICD-10-CM | POA: Diagnosis not present

## 2021-03-24 DIAGNOSIS — S41102A Unspecified open wound of left upper arm, initial encounter: Secondary | ICD-10-CM | POA: Diagnosis not present

## 2021-04-05 DIAGNOSIS — M7542 Impingement syndrome of left shoulder: Secondary | ICD-10-CM | POA: Diagnosis not present

## 2021-04-11 DIAGNOSIS — M25512 Pain in left shoulder: Secondary | ICD-10-CM | POA: Diagnosis not present

## 2021-04-17 DIAGNOSIS — M25512 Pain in left shoulder: Secondary | ICD-10-CM | POA: Diagnosis not present

## 2021-05-18 DIAGNOSIS — M7541 Impingement syndrome of right shoulder: Secondary | ICD-10-CM | POA: Diagnosis not present

## 2021-05-18 DIAGNOSIS — M24112 Other articular cartilage disorders, left shoulder: Secondary | ICD-10-CM | POA: Diagnosis not present

## 2021-05-18 DIAGNOSIS — M19012 Primary osteoarthritis, left shoulder: Secondary | ICD-10-CM | POA: Diagnosis not present

## 2021-05-18 DIAGNOSIS — G8918 Other acute postprocedural pain: Secondary | ICD-10-CM | POA: Diagnosis not present

## 2021-05-18 DIAGNOSIS — M7532 Calcific tendinitis of left shoulder: Secondary | ICD-10-CM | POA: Diagnosis not present

## 2021-05-18 DIAGNOSIS — M7542 Impingement syndrome of left shoulder: Secondary | ICD-10-CM | POA: Diagnosis not present

## 2021-05-31 DIAGNOSIS — M7542 Impingement syndrome of left shoulder: Secondary | ICD-10-CM | POA: Diagnosis not present

## 2021-06-05 DIAGNOSIS — M25612 Stiffness of left shoulder, not elsewhere classified: Secondary | ICD-10-CM | POA: Diagnosis not present

## 2021-06-05 DIAGNOSIS — M25512 Pain in left shoulder: Secondary | ICD-10-CM | POA: Diagnosis not present

## 2021-06-06 ENCOUNTER — Ambulatory Visit: Payer: BC Managed Care – PPO | Admitting: Internal Medicine

## 2021-06-08 DIAGNOSIS — M25512 Pain in left shoulder: Secondary | ICD-10-CM | POA: Diagnosis not present

## 2021-06-08 DIAGNOSIS — M25612 Stiffness of left shoulder, not elsewhere classified: Secondary | ICD-10-CM | POA: Diagnosis not present

## 2021-09-18 DIAGNOSIS — M25512 Pain in left shoulder: Secondary | ICD-10-CM | POA: Diagnosis not present

## 2021-10-11 DIAGNOSIS — F3132 Bipolar disorder, current episode depressed, moderate: Secondary | ICD-10-CM | POA: Diagnosis not present

## 2021-10-11 DIAGNOSIS — F411 Generalized anxiety disorder: Secondary | ICD-10-CM | POA: Diagnosis not present

## 2021-10-16 ENCOUNTER — Ambulatory Visit (INDEPENDENT_AMBULATORY_CARE_PROVIDER_SITE_OTHER): Payer: BC Managed Care – PPO | Admitting: Internal Medicine

## 2021-10-16 ENCOUNTER — Encounter: Payer: Self-pay | Admitting: Internal Medicine

## 2021-10-16 VITALS — BP 132/76 | HR 94 | Temp 97.8°F | Resp 16 | Ht 68.0 in | Wt 171.0 lb

## 2021-10-16 DIAGNOSIS — Z125 Encounter for screening for malignant neoplasm of prostate: Secondary | ICD-10-CM

## 2021-10-16 DIAGNOSIS — E538 Deficiency of other specified B group vitamins: Secondary | ICD-10-CM

## 2021-10-16 DIAGNOSIS — K219 Gastro-esophageal reflux disease without esophagitis: Secondary | ICD-10-CM

## 2021-10-16 DIAGNOSIS — E785 Hyperlipidemia, unspecified: Secondary | ICD-10-CM | POA: Diagnosis not present

## 2021-10-16 DIAGNOSIS — Z0000 Encounter for general adult medical examination without abnormal findings: Secondary | ICD-10-CM | POA: Diagnosis not present

## 2021-10-16 DIAGNOSIS — G63 Polyneuropathy in diseases classified elsewhere: Secondary | ICD-10-CM

## 2021-10-16 DIAGNOSIS — I10 Essential (primary) hypertension: Secondary | ICD-10-CM

## 2021-10-16 DIAGNOSIS — B354 Tinea corporis: Secondary | ICD-10-CM

## 2021-10-16 LAB — CBC WITH DIFFERENTIAL/PLATELET
Basophils Absolute: 0 10*3/uL (ref 0.0–0.1)
Basophils Relative: 0.6 % (ref 0.0–3.0)
Eosinophils Absolute: 0.2 10*3/uL (ref 0.0–0.7)
Eosinophils Relative: 2.7 % (ref 0.0–5.0)
HCT: 42.2 % (ref 39.0–52.0)
Hemoglobin: 14.6 g/dL (ref 13.0–17.0)
Lymphocytes Relative: 33.3 % (ref 12.0–46.0)
Lymphs Abs: 2 10*3/uL (ref 0.7–4.0)
MCHC: 34.6 g/dL (ref 30.0–36.0)
MCV: 86.8 fl (ref 78.0–100.0)
Monocytes Absolute: 0.6 10*3/uL (ref 0.1–1.0)
Monocytes Relative: 9.7 % (ref 3.0–12.0)
Neutro Abs: 3.3 10*3/uL (ref 1.4–7.7)
Neutrophils Relative %: 53.7 % (ref 43.0–77.0)
Platelets: 223 10*3/uL (ref 150.0–400.0)
RBC: 4.86 Mil/uL (ref 4.22–5.81)
RDW: 13.6 % (ref 11.5–15.5)
WBC: 6.1 10*3/uL (ref 4.0–10.5)

## 2021-10-16 LAB — HEPATIC FUNCTION PANEL
ALT: 28 U/L (ref 0–53)
AST: 22 U/L (ref 0–37)
Albumin: 4.9 g/dL (ref 3.5–5.2)
Alkaline Phosphatase: 20 U/L — ABNORMAL LOW (ref 39–117)
Bilirubin, Direct: 0.1 mg/dL (ref 0.0–0.3)
Total Bilirubin: 0.4 mg/dL (ref 0.2–1.2)
Total Protein: 7.4 g/dL (ref 6.0–8.3)

## 2021-10-16 LAB — URINALYSIS, ROUTINE W REFLEX MICROSCOPIC
Bilirubin Urine: NEGATIVE
Hgb urine dipstick: NEGATIVE
Ketones, ur: NEGATIVE
Leukocytes,Ua: NEGATIVE
Nitrite: NEGATIVE
RBC / HPF: NONE SEEN (ref 0–?)
Specific Gravity, Urine: <=1.005 — AB (ref 1.000–1.030)
Total Protein, Urine: NEGATIVE
Urine Glucose: NEGATIVE
Urobilinogen, UA: 0.2 (ref 0.0–1.0)
WBC, UA: NONE SEEN (ref 0–?)
pH: 5.5 (ref 5.0–8.0)

## 2021-10-16 LAB — VITAMIN B12: Vitamin B-12: 389 pg/mL (ref 211–911)

## 2021-10-16 LAB — BASIC METABOLIC PANEL
BUN: 15 mg/dL (ref 6–23)
CO2: 25 mEq/L (ref 19–32)
Calcium: 9.7 mg/dL (ref 8.4–10.5)
Chloride: 100 mEq/L (ref 96–112)
Creatinine, Ser: 1.2 mg/dL (ref 0.40–1.50)
GFR: 65.31 mL/min (ref >60.00)
Glucose, Bld: 107 mg/dL — ABNORMAL HIGH (ref 70–99)
Potassium: 4.5 mEq/L (ref 3.5–5.1)
Sodium: 136 mEq/L (ref 135–145)

## 2021-10-16 LAB — LIPID PANEL
Cholesterol: 205 mg/dL — ABNORMAL HIGH (ref 0–200)
HDL: 38.5 mg/dL — ABNORMAL LOW (ref >39.00)
LDL Cholesterol: 128 mg/dL — ABNORMAL HIGH (ref 0–99)
NonHDL: 166.73
Total CHOL/HDL Ratio: 5
Triglycerides: 195 mg/dL — ABNORMAL HIGH (ref 0.0–149.0)
VLDL: 39 mg/dL (ref 0.0–40.0)

## 2021-10-16 LAB — TSH: TSH: 1.26 u[IU]/mL (ref 0.35–5.50)

## 2021-10-16 LAB — PSA: PSA: 1.76 ng/mL (ref 0.10–4.00)

## 2021-10-16 LAB — FOLATE: Folate: 10.6 ng/mL (ref >5.9)

## 2021-10-16 MED ORDER — TERBINAFINE HCL 250 MG PO TABS: 250.0000 mg | ORAL_TABLET | Freq: Every day | ORAL | 0 refills | Status: AC

## 2021-10-16 NOTE — Patient Instructions (Signed)
Health Maintenance, Male Adopting a healthy lifestyle and getting preventive care are important in promoting health and wellness. Ask your health care provider about: The right schedule for you to have regular tests and exams. Things you can do on your own to prevent diseases and keep yourself healthy. What should I know about diet, weight, and exercise? Eat a healthy diet  Eat a diet that includes plenty of vegetables, fruits, low-fat dairy products, and lean protein. Do not eat a lot of foods that are high in solid fats, added sugars, or sodium. Maintain a healthy weight Body mass index (BMI) is a measurement that can be used to identify possible weight problems. It estimates body fat based on height and weight. Your health care provider can help determine your BMI and help you achieve or maintain a healthy weight. Get regular exercise Get regular exercise. This is one of the most important things you can do for your health. Most adults should: Exercise for at least 150 minutes each week. The exercise should increase your heart rate and make you sweat (moderate-intensity exercise). Do strengthening exercises at least twice a week. This is in addition to the moderate-intensity exercise. Spend less time sitting. Even light physical activity can be beneficial. Watch cholesterol and blood lipids Have your blood tested for lipids and cholesterol at 61 years of age, then have this test every 5 years. You may need to have your cholesterol levels checked more often if: Your lipid or cholesterol levels are high. You are older than 61 years of age. You are at high risk for heart disease. What should I know about cancer screening? Many types of cancers can be detected early and may often be prevented. Depending on your health history and family history, you may need to have cancer screening at various ages. This may include screening for: Colorectal cancer. Prostate cancer. Skin cancer. Lung  cancer. What should I know about heart disease, diabetes, and high blood pressure? Blood pressure and heart disease High blood pressure causes heart disease and increases the risk of stroke. This is more likely to develop in people who have high blood pressure readings or are overweight. Talk with your health care provider about your target blood pressure readings. Have your blood pressure checked: Every 3-5 years if you are 18-39 years of age. Every year if you are 40 years old or older. If you are between the ages of 65 and 75 and are a current or former smoker, ask your health care provider if you should have a one-time screening for abdominal aortic aneurysm (AAA). Diabetes Have regular diabetes screenings. This checks your fasting blood sugar level. Have the screening done: Once every three years after age 45 if you are at a normal weight and have a low risk for diabetes. More often and at a younger age if you are overweight or have a high risk for diabetes. What should I know about preventing infection? Hepatitis B If you have a higher risk for hepatitis B, you should be screened for this virus. Talk with your health care provider to find out if you are at risk for hepatitis B infection. Hepatitis C Blood testing is recommended for: Everyone born from 1945 through 1965. Anyone with known risk factors for hepatitis C. Sexually transmitted infections (STIs) You should be screened each year for STIs, including gonorrhea and chlamydia, if: You are sexually active and are younger than 61 years of age. You are older than 61 years of age and your   health care provider tells you that you are at risk for this type of infection. Your sexual activity has changed since you were last screened, and you are at increased risk for chlamydia or gonorrhea. Ask your health care provider if you are at risk. Ask your health care provider about whether you are at high risk for HIV. Your health care provider  may recommend a prescription medicine to help prevent HIV infection. If you choose to take medicine to prevent HIV, you should first get tested for HIV. You should then be tested every 3 months for as long as you are taking the medicine. Follow these instructions at home: Alcohol use Do not drink alcohol if your health care provider tells you not to drink. If you drink alcohol: Limit how much you have to 0-2 drinks a day. Know how much alcohol is in your drink. In the [place] one drink equals one 12 oz bottle of beer (355 mL), one 5 oz glass of wine (148 mL), or one 1 oz glass of hard liquor (44 mL). Lifestyle Do not use any products that contain nicotine or tobacco. These products include cigarettes, chewing tobacco, and vaping devices, such as e-cigarettes. If you need help quitting, ask your health care provider. Do not use street drugs. Do not share needles. Ask your health care provider for help if you need support or information about quitting drugs. General instructions Schedule regular health, dental, and eye exams. Stay current with your vaccines. Tell your health care provider if: You often feel depressed. You have ever been abused or do not feel safe at home. Summary Adopting a healthy lifestyle and getting preventive care are important in promoting health and wellness. Follow your health care provider's instructions about healthy diet, exercising, and getting tested or screened for diseases. Follow your health care provider's instructions on monitoring your cholesterol and blood pressure. This information is not intended to replace advice given to you by your health care provider. Make sure you discuss any questions you have with your health care provider. Document Revised: 08/01/2020 Document Reviewed: 08/01/2020 Elsevier Patient Education  2023 Elsevier Inc.  

## 2021-10-16 NOTE — Progress Notes (Unsigned)
Subjective:  Patient ID: Colton Andersen, male    DOB: [birth year]  Age: 61 y.o. MRN: [id number]  CC: Annual Exam and Rash   HPI CARYL MANAS presents for a CPX and f/up -  He complains of a several month history of a red rash on his right forearm.  He walks about an hour a day and does not experience chest pain, shortness of breath, diaphoresis, fatigue, dizziness, or lightheadedness.   Outpatient Medications Prior to Visit  Medication Sig Dispense Refill   Cyanocobalamin (VITAMIN B12 PO) Take 1 capsule by mouth daily.     diazepam (VALIUM) 10 MG tablet Take 10 mg by mouth daily as needed for anxiety.      ibuprofen (ADVIL) 800 MG tablet Take 1 tablet (800 mg total) by mouth every 8 (eight) hours as needed. 21 tablet 0   lamoTRIgine (LAMICTAL) 200 MG tablet Take 200 mg by mouth daily.     QUEtiapine (SEROQUEL) 400 MG tablet Take 400 mg by mouth at bedtime.   4   No facility-administered medications prior to visit.    ROS Review of Systems  Constitutional: Negative.  Negative for diaphoresis and fatigue.  HENT: Negative.    Eyes: Negative.   Respiratory:  Negative for cough, chest tightness, shortness of breath and wheezing.   Cardiovascular:  Negative for chest pain, palpitations and leg swelling.  Gastrointestinal:  Negative for abdominal pain, constipation, diarrhea, nausea and vomiting.  Endocrine: Negative.   Genitourinary: Negative.  Negative for difficulty urinating.  Musculoskeletal:  Negative for myalgias.  Skin:  Positive for color change and rash.  Neurological:  Negative for dizziness, weakness, light-headedness and headaches.  Hematological:  Negative for adenopathy. Does not bruise/bleed easily.  Psychiatric/Behavioral: Negative.      Objective:  BP 132/76 (BP Location: Right Arm, Patient Position: Sitting, Cuff Size: Large)   Pulse 94   Temp 97.8 F (36.6 C) (Oral)   Resp 16   Ht '5\' 8"'$  (1.727 m)   Wt 171 lb (77.6 kg)   SpO2 98%   BMI 26.00 kg/m    BP Readings from Last 3 Encounters:  10/16/21 132/76  10/12/20 136/78  04/29/20 136/86    Wt Readings from Last 3 Encounters:  10/16/21 171 lb (77.6 kg)  10/12/20 163 lb 6.4 oz (74.1 kg)  04/29/20 167 lb (75.8 kg)    Physical Exam Vitals reviewed.  HENT:     Nose: Nose normal.     Mouth/Throat:     Mouth: Mucous membranes are moist.  Eyes:     General: No scleral icterus.    Conjunctiva/sclera: Conjunctivae normal.  Cardiovascular:     Rate and Rhythm: Normal rate and regular rhythm.     Heart sounds: No murmur heard. Pulmonary:     Effort: Pulmonary effort is normal.     Breath sounds: No stridor. No wheezing, rhonchi or rales.  Abdominal:     General: Abdomen is flat.     Palpations: There is no mass.     Tenderness: There is no abdominal tenderness. There is no guarding.     Hernia: No hernia is present.  Musculoskeletal:        General: Normal range of motion.     Cervical back: Neck supple.     Right lower leg: No edema.     Left lower leg: No edema.  Lymphadenopathy:     Cervical: No cervical adenopathy.  Skin:    Findings: Erythema and rash present.  Neurological:     General: No focal deficit present.     Mental Status: He is alert.  Psychiatric:        Mood and Affect: Mood normal.        Behavior: Behavior normal.     Lab Results  Component Value Date   WBC 6.1 10/16/2021   HGB 14.6 10/16/2021   HCT 42.2 10/16/2021   PLT 223.0 10/16/2021   GLUCOSE 107 (H) 10/16/2021   CHOL 205 (H) 10/16/2021   TRIG 195.0 (H) 10/16/2021   HDL 38.50 (L) 10/16/2021   LDLDIRECT 153.2 11/08/2008   LDLCALC 128 (H) 10/16/2021   ALT 28 10/16/2021   AST 22 10/16/2021   NA 136 10/16/2021   K 4.5 10/16/2021   CL 100 10/16/2021   CREATININE 1.20 10/16/2021   BUN 15 10/16/2021   CO2 25 10/16/2021   TSH 1.26 10/16/2021   PSA 1.76 10/16/2021   INR 1.0 04/29/2020   HGBA1C 5.8 10/12/2020    CT CARDIAC SCORING (SELF PAY ONLY)  Addendum Date: 12/09/2020    ADDENDUM REPORT: 12/09/2020 04:50 CLINICAL DATA:  Cardiovascular disease risk stratification EXAM: CT Coronary Calcium Score TECHNIQUE: A gated, non-contrast computed tomography scan of the heart was performed using 19m slice thickness. Axial images were analyzed on a dedicated workstation. Calcium scoring of the coronary arteries was performed using the Agatston method. FINDINGS: Coronary arteries: Normal origins. Coronary Calcium Score: Left main: 0 Left anterior descending artery: 35.8 Left circumflex artery: 0 Right coronary artery: 0.7 Total: 36.5 Percentile: 53rd Pericardium: Normal. Ascending Aorta: Normal caliber. Ascending aorta measures approximately 390mat the mid ascending aorta measured in an axial plane. Non-cardiac: [facility]. IMPRESSION: Coronary calcium score of 36.5. This was 53rd percentile for age-, race-, and sex-matched controls. RECOMMENDATIONS: Coronary artery calcium (CAC) score is a strong predictor of incident coronary heart disease (CHD) and provides predictive information beyond traditional risk factors. CAC scoring is reasonable to use in the decision to withhold, postpone, or initiate statin therapy in intermediate-risk or selected borderline-risk asymptomatic adults (age 61-75ears and LDL-C >=70 to <190 mg/dL) who do not have diabetes or established atherosclerotic cardiovascular disease (ASCVD).* In intermediate-risk (10-year ASCVD risk >=7.5% to <20%) adults or selected borderline-risk (10-year ASCVD risk >=5% to <7.5%) adults in whom a CAC score is measured for the purpose of making a treatment decision the following recommendations have been made: If CAC=0, it is reasonable to withhold statin therapy and reassess in 5 to 10 years, as long as higher risk conditions are absent (diabetes mellitus, family history of premature CHD in first degree relatives (males <55 years; females <65 years), cigarette smoking, or LDL >=190 mg/dL). If CAC is 1  to 99, it is reasonable to initiate statin therapy for patients >=5538ears of age. If CAC is >=100 or >=75th percentile, it is reasonable to initiate statin therapy at any age. Cardiology referral should be considered for patients with CAC scores >=400 or >=75th percentile. *2018 AHA/ACC/AACVPR/AAPA/ABC/ACPM/ADA/AGS/APhA/ASPC/NLA/PCNA Guideline on the Management of Blood Cholesterol: A Report of the American College of Cardiology/American Heart Association Task Force on Clinical Practice Guidelines. J Am Coll Cardiol. 2019;73(24):3168-3209. Electronically Signed   By: GaCherlynn Kaiser.D.   On: 12/09/2020 04:50   Result Date: 12/09/2020 EXAM: OVER-READ INTERPRETATION  CT CHEST The following report is an over-read performed by radiologist Dr. KyAbigail Miyamotof [facility] [place] 12/08/2020. This over-read does not include interpretation of cardiac or coronary anatomy or pathology. The  calcium score interpretation by the cardiologist is attached. COMPARISON:  02/08/2010 chest radiograph. Report chest CT 01/06/2003. FINDINGS: Vascular: Aortic atherosclerosis. Mediastinum/Nodes: No imaged thoracic adenopathy. Lungs/Pleura: No pleural fluid. Left upper lobe calcified granuloma. Upper Abdomen: Normal imaged portions of the liver, spleen, stomach. Musculoskeletal: No acute osseous abnormality. IMPRESSION: 1.  No acute findings in the imaged extracardiac chest. 2.  Aortic Atherosclerosis (ICD10-I70.0). Electronically Signed: By: Abigail Miyamoto M.D. On: 12/08/2020 16:47    Assessment & Plan:   [place] was seen today for annual exam and rash.  Diagnoses and all orders for this visit:  Essential hypertension- His blood pressure is well controlled with lifestyle modifications. -     Basic metabolic panel; Future -     TSH; Future -     Urinalysis, Routine w reflex microscopic; Future -     CBC with Differential/Platelet; Future -     CBC with Differential/Platelet -     Urinalysis, Routine w reflex  microscopic -     TSH -     Basic metabolic panel  Gastroesophageal reflux disease without esophagitis- No symptoms related to this. -     CBC with Differential/Platelet; Future -     CBC with Differential/Platelet  Vitamin B12 deficiency neuropathy (HCC)- H&H, B12, and folate are normal. -     Vitamin B12; Future -     Folate; Future -     CBC with Differential/Platelet; Future -     CBC with Differential/Platelet -     Folate -     Vitamin B12  Hyperlipidemia LDL goal <130- Statin is not indicated. -     TSH; Future -     Hepatic function panel; Future -     Hepatic function panel -     TSH  Routine general medical examination at a health care facility- Exam completed, labs reviewed, vaccines reviewed-he refused a pneumonia vaccine and shingles vaccine, cancer screenings are up-to-date, patient education was given. -     Lipid panel; Future -     PSA; Future -     PSA -     Lipid panel  Tinea corporis -     terbinafine (LAMISIL) 250 MG tablet; Take 1 tablet (250 mg total) by mouth daily for 14 days.   I am having Hinda Lenis. Raptis start on terbinafine. I am also having him maintain his lamoTRIgine, diazepam, QUEtiapine, Cyanocobalamin (VITAMIN B12 PO), and ibuprofen.  Meds ordered this encounter  Medications   terbinafine (LAMISIL) 250 MG tablet    Sig: Take 1 tablet (250 mg total) by mouth daily for 14 days.    Dispense:  14 tablet    Refill:  0     Follow-up: Return in about 6 months (around 04/18/2022).  Scarlette Calico, MD

## 2021-10-31 ENCOUNTER — Encounter: Payer: Self-pay | Admitting: Internal Medicine

## 2021-11-01 NOTE — Telephone Encounter (Signed)
Call pt no answer [facility] sent mychart msg as well, but can he see MD this Friday at 9. Call or respond to msg with yes or no.Marland KitchenAndee Poles

## 2021-11-01 NOTE — Telephone Encounter (Signed)
Patient called back and said that he will be seen this Friday at 9:00.

## 2021-11-01 NOTE — Telephone Encounter (Signed)
Noted.Colton Kitchenschedule for 9:00.Colton KitchenJohny Chess

## 2021-11-03 ENCOUNTER — Ambulatory Visit: Payer: BC Managed Care – PPO | Admitting: Internal Medicine

## 2021-11-03 ENCOUNTER — Encounter: Payer: Self-pay | Admitting: Internal Medicine

## 2021-11-03 ENCOUNTER — Other Ambulatory Visit: Payer: Self-pay | Admitting: Internal Medicine

## 2021-11-03 DIAGNOSIS — L920 Granuloma annulare: Secondary | ICD-10-CM | POA: Diagnosis not present

## 2021-11-03 DIAGNOSIS — L989 Disorder of the skin and subcutaneous tissue, unspecified: Secondary | ICD-10-CM | POA: Insufficient documentation

## 2021-11-03 MED ORDER — CLOBETASOL PROPIONATE 0.05 % EX OINT: 1.0000 | TOPICAL_OINTMENT | Freq: Two times a day (BID) | CUTANEOUS | 0 refills | Status: DC

## 2021-11-03 NOTE — Patient Instructions (Addendum)
Granuloma annulare   Skin Biopsy, Care After The following information offers guidance on how to care for yourself after your procedure. Your health care provider may also give you more specific instructions. If you have problems or questions, contact your health care provider. What can I expect after the procedure? After the procedure, it is common to have: Soreness or mild pain. Bruising. Itching. Some redness and swelling. Follow these instructions at home: Biopsy site care  Follow instructions from your health care provider about how to take care of your biopsy site. Make sure you: Wash your hands with soap and water for at least 20 seconds before and after you change your bandage (dressing). If soap and water are not available, use hand sanitizer. Change your dressing as told by your health care provider. Leave stitches (sutures), skin glue, or adhesive strips in place. These skin closures may need to stay in place for 2 weeks or longer. If adhesive strip edges start to loosen and curl up, you may trim the loose edges. Do not remove adhesive strips completely unless your health care provider tells you to do that. Check your biopsy site every day for signs of infection. Check for: More redness, swelling, or pain. Fluid or blood. Warmth. Pus or a bad smell. Do not take baths, swim, or use a hot tub until your health care provider approves. Ask your health care provider if you may take showers. You may only be allowed to take sponge baths. General instructions Take over-the-counter and prescription medicines only as told by your health care provider. Return to your normal activities as told by your health care provider. Ask your health care provider what activities are safe for you. Keep all follow-up visits. This is important. Contact a health care provider if: You have more redness, swelling, or pain around your biopsy site. You have fluid or blood coming from your biopsy  site. Your biopsy site feels warm to the touch. You have pus or a bad smell coming from your biopsy site. You have a fever. Your sutures, skin glue, or adhesive strips loosen or come off sooner than expected. Get help right away if: You have bleeding that does not stop with pressure or a dressing. Summary After the procedure, it is common to have soreness, bruising, and itching at the site. Follow instructions from your health care provider about how to take care of your biopsy site. Check your biopsy site every day for signs of infection. Contact a health care provider if you have more redness, swelling, or pain around your biopsy site, or your biopsy site feels warm to the touch. Keep all follow-up visits. This is important. This information is not intended to replace advice given to you by your health care provider. Make sure you discuss any questions you have with your health care provider. Document Revised: 10/11/2020 Document Reviewed: 10/11/2020 Elsevier Patient Education  [employer].

## 2021-11-03 NOTE — Progress Notes (Signed)
Subjective:  Patient ID: Colton Andersen, male    DOB: [birth year]  Age: 61 y.o. MRN: [id number]  CC: Rash   HPI Colton Andersen presents for f/up -  He tells me the rash on his right forearm did not respond to systemic terbinafine.  It was previously asymptomatic but now he tells me it is itchy.  He complains of arthralgias but denies any other symptoms.  Outpatient Medications Prior to Visit  Medication Sig Dispense Refill   Cyanocobalamin (VITAMIN B12 PO) Take 1 capsule by mouth daily.     diazepam (VALIUM) 10 MG tablet Take 10 mg by mouth daily as needed for anxiety.      ibuprofen (ADVIL) 800 MG tablet Take 1 tablet (800 mg total) by mouth every 8 (eight) hours as needed. 21 tablet 0   lamoTRIgine (LAMICTAL) 200 MG tablet Take 200 mg by mouth daily.     QUEtiapine (SEROQUEL) 400 MG tablet Take 400 mg by mouth at bedtime.   4   No facility-administered medications prior to visit.    ROS Review of Systems  Musculoskeletal:  Positive for arthralgias. Negative for joint swelling.  Skin:  Positive for rash.  Hematological:  Negative for adenopathy. Does not bruise/bleed easily.  All other systems reviewed and are negative.   Objective:  There were no vitals taken for this visit.  BP Readings from Last 3 Encounters:  10/16/21 132/76  10/12/20 136/78  04/29/20 136/86    Wt Readings from Last 3 Encounters:  10/16/21 171 lb (77.6 kg)  10/12/20 163 lb 6.4 oz (74.1 kg)  04/29/20 167 lb (75.8 kg)    Physical Exam Skin:    Findings: Rash is not macular, nodular, papular, purpuric, pustular, scaling or vesicular.     Comments: Dorsal, right, distal forearm there is a serpiginous plaque that has a raised border that is erythematous.  It does not look like ECM.     Lab Results  Component Value Date   WBC 6.1 10/16/2021   HGB 14.6 10/16/2021   HCT 42.2 10/16/2021   PLT 223.0 10/16/2021   GLUCOSE 107 (H) 10/16/2021   CHOL 205 (H) 10/16/2021   TRIG 195.0 (H)  10/16/2021   HDL 38.50 (L) 10/16/2021   LDLDIRECT 153.2 11/08/2008   LDLCALC 128 (H) 10/16/2021   ALT 28 10/16/2021   AST 22 10/16/2021   NA 136 10/16/2021   K 4.5 10/16/2021   CL 100 10/16/2021   CREATININE 1.20 10/16/2021   BUN 15 10/16/2021   CO2 25 10/16/2021   TSH 1.26 10/16/2021   PSA 1.76 10/16/2021   INR 1.0 04/29/2020   HGBA1C 5.8 10/12/2020    CT CARDIAC SCORING (SELF PAY ONLY)  Addendum Date: 12/09/2020   ADDENDUM REPORT: 12/09/2020 04:50 CLINICAL DATA:  Cardiovascular disease risk stratification EXAM: CT Coronary Calcium Score TECHNIQUE: A gated, non-contrast computed tomography scan of the heart was performed using 59m slice thickness. Axial images were analyzed on a dedicated workstation. Calcium scoring of the coronary arteries was performed using the Agatston method. FINDINGS: Coronary arteries: Normal origins. Coronary Calcium Score: Left main: 0 Left anterior descending artery: 35.8 Left circumflex artery: 0 Right coronary artery: 0.7 Total: 36.5 Percentile: 53rd Pericardium: Normal. Ascending Aorta: Normal caliber. Ascending aorta measures approximately 314mat the mid ascending aorta measured in an axial plane. [facility]-cardiac: [facility]. IMPRESSION: Coronary calcium score of 36.5. This was 53rd percentile for age-, race-, and sex-matched controls. RECOMMENDATIONS: Coronary artery calcium (CAC)  score is a strong predictor of incident coronary heart disease (CHD) and provides predictive information beyond traditional risk factors. CAC scoring is reasonable to use in the decision to withhold, postpone, or initiate statin therapy in intermediate-risk or selected borderline-risk asymptomatic adults (age 34-75 years and LDL-C >=70 to <190 mg/dL) who do not have diabetes or established atherosclerotic cardiovascular disease (ASCVD).* In intermediate-risk (10-year ASCVD risk >=7.5% to <20%) adults or selected borderline-risk (10-year ASCVD risk >=5% to  <7.5%) adults in whom a CAC score is measured for the purpose of making a treatment decision the following recommendations have been made: If CAC=0, it is reasonable to withhold statin therapy and reassess in 5 to 10 years, as long as higher risk conditions are absent (diabetes mellitus, family history of premature CHD in first degree relatives (males <55 years; females <65 years), cigarette smoking, or LDL >=190 mg/dL). If CAC is 1 to 99, it is reasonable to initiate statin therapy for patients >=69 years of age. If CAC is >=100 or >=75th percentile, it is reasonable to initiate statin therapy at any age. Cardiology referral should be considered for patients with CAC scores >=400 or >=75th percentile. *2018 AHA/ACC/AACVPR/AAPA/ABC/ACPM/ADA/AGS/APhA/ASPC/NLA/PCNA Guideline on the Management of Blood Cholesterol: A Report of the American College of Cardiology/American Heart Association Task Force on Clinical Practice Guidelines. J Am Coll Cardiol. 2019;73(24):3168-3209. Electronically Signed   By: Cherlynn Kaiser M.D.   On: 12/09/2020 04:50   Result Date: 12/09/2020 EXAM: OVER-READ INTERPRETATION  CT CHEST The following report is an over-read performed by radiologist Dr. Abigail Miyamoto of [facility], [place] on 12/08/2020. This over-read does not include interpretation of cardiac or coronary anatomy or pathology. The calcium score interpretation by the cardiologist is attached. COMPARISON:  02/08/2010 chest radiograph. Report chest CT 01/06/2003. FINDINGS: Vascular: Aortic atherosclerosis. Mediastinum/Nodes: No imaged thoracic adenopathy. Lungs/Pleura: No pleural fluid. Left upper lobe calcified granuloma. Upper Abdomen: Normal imaged portions of the liver, spleen, stomach. Musculoskeletal: No acute osseous abnormality. IMPRESSION: 1.  No acute findings in the imaged extracardiac chest. 2.  Aortic Atherosclerosis (ICD10-I70.0). Electronically Signed: By: Abigail Miyamoto M.D. On: 12/08/2020 16:47    Assessment &  Plan:   Rabon was seen today for rash.  Diagnoses and all orders for this visit:  Skin lesion- Will evaluate for tinea and malignancy. 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  Will treat with a potent topical steroid ointment. -     clobetasol ointment (TEMOVATE) 0.05 %; Apply 1 Application topically 2 (two) times daily. -     Cancel: Skin biopsy; Future -     Cancel: Skin biopsy -     Cancel: Skin biopsy; Future -     Cancel: Skin biopsy -     Dermatology pathology   I am having Hinda Lenis. Querry start on clobetasol ointment. I am also having him maintain his lamoTRIgine, diazepam, QUEtiapine, Cyanocobalamin (VITAMIN B12 PO), and ibuprofen.  Meds ordered this encounter  Medications   clobetasol ointment (TEMOVATE) 0.05 %    Sig: Apply 1 Application topically 2 (two) times daily.    Dispense:  45 g    Refill:  0   After informed verbal consent was obtained, using Betadine for cleansing and 2% Lidocaine with epinephrine for  anesthetic, with sterile technique a 6 mm punch biopsy was used to obtain a biopsy specimen of the lesion. Hemostasis was obtained by pressure and wound was sutured. Antibiotic dressing is applied, and wound care instructions provided. Be alert for any signs of cutaneous infection. The specimen is labeled and sent to pathology for evaluation. The procedure was well tolerated without complications.   Follow-up: Return in about 1 week (around 11/10/2021).  Scarlette Calico, MD

## 2021-11-10 ENCOUNTER — Ambulatory Visit: Payer: BC Managed Care – PPO | Admitting: Internal Medicine

## 2021-11-10 ENCOUNTER — Encounter: Payer: Self-pay | Admitting: Internal Medicine

## 2021-11-10 VITALS — BP 136/84 | HR 80 | Temp 97.6°F | Ht 68.0 in | Wt 168.0 lb

## 2021-11-10 DIAGNOSIS — L920 Granuloma annulare: Secondary | ICD-10-CM | POA: Diagnosis not present

## 2021-11-10 NOTE — Progress Notes (Unsigned)
Subjective:  Patient ID: Colton Andersen, male    DOB: 1960/10/18  Age: 61 y.o. MRN: [id number]  CC: Wound Check   HPI Colton Andersen presents for suture removal - right forearm.  The area has healed nicely and he has no complaints.  The rash is improving with the application of a potent topical steroid ointment.  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  Outpatient Medications Prior to Visit  Medication Sig Dispense Refill   clobetasol ointment (TEMOVATE) 0.05 % Apply 1 Application topically 2 (two) times daily. 45 g 0   Cyanocobalamin (VITAMIN B12 PO) Take 1 capsule by mouth daily.     diazepam (VALIUM) 10 MG tablet Take 10 mg by mouth daily as needed for anxiety.      ibuprofen (ADVIL) 800 MG tablet Take 1 tablet (800 mg total) by mouth every 8 (eight) hours as needed. 21 tablet 0   lamoTRIgine (LAMICTAL) 200 MG tablet Take 200 mg by mouth daily.     QUEtiapine (SEROQUEL) 400 MG tablet Take 400 mg by mouth at bedtime.   4   No facility-administered medications prior to visit.    ROS Review of Systems  All other systems reviewed and are negative.   Objective:  BP 136/84   Pulse 80   Temp 97.6 F (36.4 C) (Oral)   Ht 5\' 8"  (1.727 m)   Wt 168 lb (76.2 kg)   SpO2 97%   BMI 25.54 kg/m   BP Readings from Last 3 Encounters:  11/10/21 136/84  10/16/21 132/76  10/12/20 136/78    Wt Readings from Last 3 Encounters:  11/10/21 168 lb (76.2 kg)  10/16/21 171 lb (77.6 kg)  10/12/20 163 lb 6.4 oz (74.1 kg)    Physical Exam Skin:    Comments: The rash of is improving and the biopsy site is healing.  1 suture removed.     Lab Results  Component Value Date   WBC 6.1 10/16/2021   HGB 14.6 10/16/2021   HCT 42.2 10/16/2021   PLT 223.0 10/16/2021   GLUCOSE 107 (H) 10/16/2021   CHOL 205 (H) 10/16/2021   TRIG 195.0 (H) 10/16/2021   HDL 38.50 (L) 10/16/2021   LDLDIRECT 153.2 11/08/2008   LDLCALC 128 (H) 10/16/2021   ALT 28 10/16/2021   AST 22 10/16/2021   NA  136 10/16/2021   K 4.5 10/16/2021   CL 100 10/16/2021   CREATININE 1.20 10/16/2021   BUN 15 10/16/2021   CO2 25 10/16/2021   TSH 1.26 10/16/2021   PSA 1.76 10/16/2021   INR 1.0 04/29/2020   HGBA1C 5.8 10/12/2020    CT CARDIAC SCORING (SELF PAY ONLY)  Addendum Date: 12/09/2020   ADDENDUM REPORT: 12/09/2020 04:50 CLINICAL DATA:  Cardiovascular disease risk stratification EXAM: CT Coronary Calcium Score TECHNIQUE: A gated, non-contrast computed tomography scan of the heart was performed using 3mm slice thickness. Axial images were analyzed on a dedicated workstation. Calcium scoring of the coronary arteries was performed using the Agatston method. FINDINGS: Coronary arteries: Normal origins. Coronary Calcium Score: Left main: 0 Left anterior descending artery: 35.8 Left circumflex artery: 0 Right coronary artery: 0.7 Total: 36.5 Percentile: 53rd Pericardium: Normal. Ascending Aorta: Normal caliber. Ascending aorta measures approximately 31mm at the mid ascending aorta measured in an axial plane. Non-cardiac: See separate report from [facility]. IMPRESSION: Coronary calcium score of 36.5. This was 53rd percentile for age-, race-, and sex-matched controls. RECOMMENDATIONS: Coronary artery calcium (CAC) score is a strong predictor of  incident coronary heart disease (CHD) and provides predictive information beyond traditional risk factors. CAC scoring is reasonable to use in the decision to withhold, postpone, or initiate statin therapy in intermediate-risk or selected borderline-risk asymptomatic adults (age 30-75 years and LDL-C >=70 to <190 mg/dL) who do not have diabetes or established atherosclerotic cardiovascular disease (ASCVD).* In intermediate-risk (10-year ASCVD risk >=7.5% to <20%) adults or selected borderline-risk (10-year ASCVD risk >=5% to <7.5%) adults in whom a CAC score is measured for the purpose of making a treatment decision the following recommendations have been made: If  CAC=0, it is reasonable to withhold statin therapy and reassess in 5 to 10 years, as long as higher risk conditions are absent (diabetes mellitus, family history of premature CHD in first degree relatives (males <55 years; females <65 years), cigarette smoking, or LDL >=190 mg/dL). If CAC is 1 to 99, it is reasonable to initiate statin therapy for patients >=32 years of age. If CAC is >=100 or >=75th percentile, it is reasonable to initiate statin therapy at any age. Cardiology referral should be considered for patients with CAC scores >=400 or >=75th percentile. *2018 AHA/ACC/AACVPR/AAPA/ABC/ACPM/ADA/AGS/APhA/ASPC/NLA/PCNA Guideline on the Management of Blood Cholesterol: A Report of the American College of Cardiology/American Heart Association Task Force on Clinical Practice Guidelines. J Am Coll Cardiol. 2019;73(24):3168-3209. Electronically Signed   By: Weston Brass M.D.   On: 12/09/2020 04:50   Result Date: 12/09/2020 EXAM: OVER-READ INTERPRETATION  CT CHEST The following report is an over-read performed by radiologist Dr. Jeronimo Greaves of [facility] on 12/08/2020. This over-read does not include interpretation of cardiac or coronary anatomy or pathology. The calcium score interpretation by the cardiologist is attached. COMPARISON:  02/08/2010 chest radiograph. Report chest CT 01/06/2003. FINDINGS: Vascular: Aortic atherosclerosis. Mediastinum/Nodes: No imaged thoracic adenopathy. Lungs/Pleura: No pleural fluid. Left upper lobe calcified granuloma. Upper Abdomen: Normal imaged portions of the liver, spleen, stomach. Musculoskeletal: No acute osseous abnormality. IMPRESSION: 1.  No acute findings in the imaged extracardiac chest. 2.  Aortic Atherosclerosis (ICD10-I70.0). Electronically Signed: By: Jeronimo Greaves M.D. On: 12/08/2020 16:47    Assessment & Plan:   There are no diagnoses linked to this encounter. I am having Colton Andersen maintain his lamoTRIgine, diazepam, QUEtiapine,  Cyanocobalamin (VITAMIN B12 PO), ibuprofen, and clobetasol ointment.  No orders of the defined types were placed in this encounter.    Follow-up: No follow-ups on file.  Sanda Linger, MD

## 2021-11-11 ENCOUNTER — Encounter: Payer: Self-pay | Admitting: Internal Medicine

## 2021-12-12 ENCOUNTER — Encounter: Payer: Self-pay | Admitting: Gastroenterology

## 2021-12-14 ENCOUNTER — Ambulatory Visit: Payer: BC Managed Care – PPO | Admitting: Internal Medicine

## 2021-12-19 ENCOUNTER — Encounter: Payer: Self-pay | Admitting: Gastroenterology

## 2021-12-19 ENCOUNTER — Telehealth: Payer: Self-pay | Admitting: Gastroenterology

## 2021-12-19 NOTE — Telephone Encounter (Signed)
Returned call to patient. Pt states that he received his updated recall letter for August 2025. Pt states that about a year ago his brother was diagnosed with Stage III colon cancer, he is 61 years old. Pt also states that he will be retiring next year and won't have insurance after that. Pt states that he won't pay for a colonoscopy out of pocket. Please advise on scheduling. Thank you.

## 2021-12-19 NOTE — Telephone Encounter (Signed)
Thank you for the note.  His colonoscopy interval was changed due to updated polyp surveillance guidelines. However, considering the new information about his brother's colon cancer diagnosis, this patient should have a colonoscopy by the end of this calendar year.  It can be directly booked with me in the [facility].  - H. Danis

## 2021-12-19 NOTE — Telephone Encounter (Signed)
Noted, thank you

## 2021-12-19 NOTE — Telephone Encounter (Signed)
Patient has been scheduled for PV on 10/26 at 8:00 and colonoscopy on 11/16 at 8:00!

## 2021-12-19 NOTE — Telephone Encounter (Signed)
Inbound call from patient stating that he received a letter in the mail stating that his colonoscopy date had changed from 2023 to   August of 2025. Patient is requesting a call back to discuss. Please advise.   [phone]

## 2022-01-10 DIAGNOSIS — F411 Generalized anxiety disorder: Secondary | ICD-10-CM | POA: Diagnosis not present

## 2022-01-10 DIAGNOSIS — F3132 Bipolar disorder, current episode depressed, moderate: Secondary | ICD-10-CM | POA: Diagnosis not present

## 2022-01-18 ENCOUNTER — Ambulatory Visit (AMBULATORY_SURGERY_CENTER): Payer: Self-pay | Admitting: *Deleted

## 2022-01-18 VITALS — Ht 68.0 in | Wt 166.8 lb

## 2022-01-18 DIAGNOSIS — Z86010 Personal history of colonic polyps: Secondary | ICD-10-CM

## 2022-01-18 MED ORDER — NA SULFATE-K SULFATE-MG SULF 17.5-3.13-1.6 GM/177ML PO SOLN: 1.0000 | Freq: Once | ORAL | 0 refills | Status: AC

## 2022-01-18 NOTE — Progress Notes (Signed)

## 2022-01-31 ENCOUNTER — Encounter: Payer: Self-pay | Admitting: Gastroenterology

## 2022-02-01 ENCOUNTER — Encounter: Payer: Self-pay | Admitting: Certified Registered Nurse Anesthetist

## 2022-02-08 ENCOUNTER — Encounter: Payer: Self-pay | Admitting: Gastroenterology

## 2022-02-08 ENCOUNTER — Ambulatory Visit (AMBULATORY_SURGERY_CENTER): Payer: BC Managed Care – PPO | Admitting: Gastroenterology

## 2022-02-08 VITALS — BP 123/69 | HR 71 | Temp 97.1°F | Resp 20 | Ht 68.0 in | Wt 166.8 lb

## 2022-02-08 DIAGNOSIS — Z1211 Encounter for screening for malignant neoplasm of colon: Secondary | ICD-10-CM

## 2022-02-08 DIAGNOSIS — Z09 Encounter for follow-up examination after completed treatment for conditions other than malignant neoplasm: Secondary | ICD-10-CM | POA: Diagnosis not present

## 2022-02-08 DIAGNOSIS — D122 Benign neoplasm of ascending colon: Secondary | ICD-10-CM

## 2022-02-08 DIAGNOSIS — Z86010 Personal history of colonic polyps: Secondary | ICD-10-CM | POA: Diagnosis not present

## 2022-02-08 DIAGNOSIS — K5140 Inflammatory polyps of colon without complications: Secondary | ICD-10-CM

## 2022-02-08 MED ORDER — SODIUM CHLORIDE 0.9 % IV SOLN: 500.0000 mL | INTRAVENOUS | Status: DC

## 2022-02-08 NOTE — Progress Notes (Signed)
Called to room to assist during endoscopic procedure.  Patient ID and intended procedure confirmed with present staff. Received instructions for my participation in the procedure from the performing physician.  

## 2022-02-08 NOTE — Progress Notes (Signed)
History and Physical:  This patient presents for endoscopic testing for: Encounter Diagnosis  Name Primary?   Personal history of colonic polyps Yes    Aug 2018  84m TA Patient denies chronic abdominal pain, rectal bleeding, constipation or diarrhea.   Patient is otherwise without complaints or active issues today.   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 Yes Did it involve sudden or severe rash/hives, skin peeling, or any reaction on the inside of your mouth or nose? Yes Did you need to seek medical attention at a hospital or doctor's office? Yes When did it last happen? 2 years ago (2019)     If all above answers are "NO", may proceed with cephalosporin use.   Codeine Itching    Outpatient Meds: Current Outpatient Medications  Medication Sig Dispense Refill   lamoTRIgine (LAMICTAL) 200 MG tablet Take 200 mg by mouth daily.     QUEtiapine (SEROQUEL) 400 MG tablet Take 400 mg by mouth at bedtime.   4   clobetasol ointment (TEMOVATE) 04.00% Apply 1 Application topically 2 (two) times daily. 45 g 0   diazepam (VALIUM) 10 MG tablet Take 10 mg by mouth daily as needed for anxiety.      Current  Facility-Administered Medications  Medication Dose Route Frequency Provider Last Rate Last Admin   0.9 %  sodium chloride infusion  500 mL Intravenous Continuous DNelida MeuseIII, MD          ___________________________________________________________________ Objective   Exam:  BP (!) 145/87   Pulse 79   Temp (!) 97.1 F (36.2 C)   Resp 18   Ht '5\' 8"'$  (1.727 m)   Wt 166 lb 12.8 oz (75.7 kg)   SpO2 97%   BMI 25.36 kg/m   CV: regular , S1/S2 Resp: clear to auscultation bilaterally, normal RR and effort noted GI: soft, no tenderness, with active bowel sounds.   Assessment: Encounter Diagnosis  Name Primary?   Personal history of colonic polyps Yes     Plan: Colonoscopy  The benefits and risks of the planned procedure were described in detail with the patient or (when appropriate) their health care proxy.  Risks were outlined as including, but not limited to, bleeding, infection, perforation, adverse medication reaction leading to cardiac or pulmonary decompensation, pancreatitis (if ERCP).  The limitation of incomplete mucosal visualization was also discussed.  No guarantees or warranties were given.     The patient is appropriate for an endoscopic procedure in the ambulatory setting.   - HWilfrid Lund MD

## 2022-02-08 NOTE — Progress Notes (Signed)
Vital signs checked by:DT  The patient states no changes in medical or surgical history since pre-visit screening on 01/18/22.

## 2022-02-08 NOTE — Op Note (Signed)
[place] Patient Name: Colton Andersen Procedure Date: 02/08/2022 7:23 AM MRN: [id number] Endoscopist: Mallie Mussel L. Loletha Carrow , MD, [id number] Age: 61 Referring MD:  Date of Birth: 09-19-1960 Gender: Male Account #: [id number] Procedure:                Colonoscopy Indications:              Surveillance: Personal history of adenomatous                            polyps on last colonoscopy 5 years ago                           <17m TA Aug 2018; <158mTA May 2013 Medicines:                Monitored Anesthesia Care Procedure:                Pre-Anesthesia Assessment:                           - Prior to the procedure, a History and Physical                            was performed, and patient medications and                            allergies were reviewed. The patient's tolerance of                            previous anesthesia was also reviewed. The risks                            and benefits of the procedure and the sedation                            options and risks were discussed with the patient.                            All questions were answered, and informed consent                            was obtained. Prior Anticoagulants: The patient has                            taken no anticoagulant or antiplatelet agents. ASA                            Grade Assessment: II - A patient with mild systemic                            disease. After reviewing the risks and benefits,                            the patient was deemed in satisfactory condition to  undergo the procedure.                           After obtaining informed consent, the colonoscope                            was passed under direct vision. Throughout the                            procedure, the patient's blood pressure, pulse, and                            oxygen saturations were monitored continuously. The                            Olympus PCF-H190DL (#[id number])  Colonoscope was                            introduced through the anus and advanced to the the                            cecum, identified by appendiceal orifice and                            ileocecal valve. The colonoscopy was performed                            without difficulty (use adult scope for next exam).                            The patient tolerated the procedure well. The                            quality of the bowel preparation was fair in some                            areas (fbrous debris that could not be cleared).                            The ileocecal valve, appendiceal orifice, and                            rectum were photographed. The bowel preparation                            used was SUPREP. Scope In: 8:11:18 AM Scope Out: 8:26:03 AM Scope Withdrawal Time: 0 hours 11 minutes 16 seconds  Total Procedure Duration: 0 hours 14 minutes 45 seconds  Findings:                 The perianal and digital rectal examinations were                            normal.  Repeat examination of right colon under NBI                            performed.                           A 6 mm polyp was found in the ascending colon. The                            polyp was pedunculated. The polyp was removed with                            a hot snare. Resection and retrieval were complete.                           Multiple diverticula were found in the left colon.                           Internal hemorrhoids were found.                           The exam was otherwise without abnormality on                            direct and retroflexion views. Complications:            No immediate complications. Estimated Blood Loss:     Estimated blood loss: none. Impression:               - Preparation of the colon was fair.                           - One 6 mm polyp in the ascending colon, removed                            with a hot snare. Resected and  retrieved.                           - Diverticulosis in the left colon.                           - Internal hemorrhoids.                           - The examination was otherwise normal on direct                            and retroflexion views. Recommendation:           - Patient has a contact number available for                            emergencies. The signs and symptoms of potential                            delayed complications were discussed with the  patient. Return to normal activities tomorrow.                            Written discharge instructions were provided to the                            patient.                           - Resume previous diet.                           - Continue present medications.                           - Await pathology results.                           - Repeat colonoscopy in 5 years for surveillance,                            even if polyp not precancerous (see prep notes                            above). For next exam, closer attention to                            pre-procedure dietary restrictions re: fibrous                            foods and consume more water with prep. [name] L. Loletha Carrow, MD 02/08/2022 8:34:31 AM This report has been signed electronically.

## 2022-02-08 NOTE — Patient Instructions (Signed)
Handouts on hemorrhoids, diverticulosis, and polyps given to patient. Await pathology results. Resume previous diet and continue present medications. Repeat colonoscopy in likely 5 years for surveillance purposes. Wait on pathology results for more definitive timeline.  YOU HAD AN ENDOSCOPIC PROCEDURE TODAY AT [facility]:   Refer to the procedure report that was given to you for any specific questions about what was found during the examination.  If the procedure report does not answer your questions, please call your gastroenterologist to clarify.  If you requested that your care partner not be given the details of your procedure findings, then the procedure report has been included in a sealed envelope for you to review at your convenience later.  YOU SHOULD EXPECT: Some feelings of bloating in the abdomen. Passage of more gas than usual.  Walking can help get rid of the air that was put into your GI tract during the procedure and reduce the bloating. If you had a lower endoscopy (such as a colonoscopy or flexible sigmoidoscopy) you may notice spotting of blood in your stool or on the toilet paper. If you underwent a bowel prep for your procedure, you may not have a normal bowel movement for a few days.  Please Note:  You might notice some irritation and congestion in your nose or some drainage.  This is from the oxygen used during your procedure.  There is no need for concern and it should clear up in a day or so.  SYMPTOMS TO REPORT IMMEDIATELY:  Following lower endoscopy (colonoscopy or flexible sigmoidoscopy):  Excessive amounts of blood in the stool  Significant tenderness or worsening of abdominal pains  Swelling of the abdomen that is new, acute  Fever of 100F or higher  For urgent or emergent issues, a gastroenterologist can be reached at any hour by calling [phone]. Do not use MyChart messaging for urgent concerns.    DIET:  We do recommend a small meal at  first, but then you may proceed to your regular diet.  Drink plenty of fluids but you should avoid alcoholic beverages for 24 hours.  ACTIVITY:  You should plan to take it easy for the rest of today and you should NOT DRIVE or use heavy machinery until tomorrow (because of the sedation medicines used during the test).    FOLLOW UP: Our staff will call the number listed on your records the next business day following your procedure.  We will call around 7:15- 8:00 am to check on you and address any questions or concerns that you may have regarding the information given to you following your procedure. If we do not reach you, we will leave a message.     If any biopsies were taken you will be contacted by phone or by letter within the next 1-3 weeks.  Please call us at [phone] if you have not heard about the biopsies in 3 weeks.    SIGNATURES/CONFIDENTIALITY: You and/or your care partner have signed paperwork which will be entered into your electronic medical record.  These signatures attest to the fact that that the information above on your After Visit Summary has been reviewed and is understood.  Full responsibility of the confidentiality of this discharge information lies with you and/or your care-partner.

## 2022-02-08 NOTE — Progress Notes (Signed)
Report given to PACU, vss 

## 2022-02-09 ENCOUNTER — Telehealth: Payer: Self-pay | Admitting: *Deleted

## 2022-02-09 NOTE — Telephone Encounter (Signed)
  Follow up Call-     02/08/2022    7:01 AM  Call back number  Post procedure Call Back phone  # [phone]  Permission to leave phone message Yes     Patient questions:  Do you have a fever, pain , or abdominal swelling? No. Pain Score  0 *  Have you tolerated food without any problems? Yes.    Have you been able to return to your normal activities? Yes.    Do you have any questions about your discharge instructions: Diet   No. Medications  No. Follow up visit  No.  Do you have questions or concerns about your Care? No.  Actions: * If pain score is 4 or above: No action needed, pain <4.

## 2022-02-18 ENCOUNTER — Encounter: Payer: Self-pay | Admitting: Gastroenterology

## 2022-04-04 DIAGNOSIS — M79672 Pain in left foot: Secondary | ICD-10-CM | POA: Diagnosis not present

## 2022-04-04 DIAGNOSIS — M79671 Pain in right foot: Secondary | ICD-10-CM | POA: Diagnosis not present

## 2022-04-11 DIAGNOSIS — F3132 Bipolar disorder, current episode depressed, moderate: Secondary | ICD-10-CM | POA: Diagnosis not present

## 2022-04-11 DIAGNOSIS — F411 Generalized anxiety disorder: Secondary | ICD-10-CM | POA: Diagnosis not present

## 2022-04-13 DIAGNOSIS — M79671 Pain in right foot: Secondary | ICD-10-CM | POA: Diagnosis not present

## 2022-04-27 DIAGNOSIS — M79671 Pain in right foot: Secondary | ICD-10-CM | POA: Diagnosis not present

## 2022-07-11 DIAGNOSIS — F3132 Bipolar disorder, current episode depressed, moderate: Secondary | ICD-10-CM | POA: Diagnosis not present

## 2022-07-11 DIAGNOSIS — F411 Generalized anxiety disorder: Secondary | ICD-10-CM | POA: Diagnosis not present

## 2022-10-09 ENCOUNTER — Ambulatory Visit: Payer: BC Managed Care – PPO | Admitting: Emergency Medicine

## 2022-10-09 ENCOUNTER — Encounter: Payer: Self-pay | Admitting: Emergency Medicine

## 2022-10-09 ENCOUNTER — Ambulatory Visit (INDEPENDENT_AMBULATORY_CARE_PROVIDER_SITE_OTHER): Payer: BC Managed Care – PPO

## 2022-10-09 VITALS — BP 126/80 | HR 77 | Temp 97.6°F | Ht 68.0 in | Wt 159.4 lb

## 2022-10-09 DIAGNOSIS — R079 Chest pain, unspecified: Secondary | ICD-10-CM

## 2022-10-09 LAB — LIPID PANEL
Cholesterol: 206 mg/dL — ABNORMAL HIGH (ref 0–200)
HDL: 41.7 mg/dL (ref >39.00)
LDL Cholesterol: 127 mg/dL — ABNORMAL HIGH (ref 0–99)
NonHDL: 164.62
Total CHOL/HDL Ratio: 5
Triglycerides: 190 mg/dL — ABNORMAL HIGH (ref 0.0–149.0)
VLDL: 38 mg/dL (ref 0.0–40.0)

## 2022-10-09 LAB — CBC WITH DIFFERENTIAL/PLATELET
Basophils Absolute: 0.1 10*3/uL (ref 0.0–0.1)
Basophils Relative: 1.3 % (ref 0.0–3.0)
Eosinophils Absolute: 0.3 10*3/uL (ref 0.0–0.7)
Eosinophils Relative: 4 % (ref 0.0–5.0)
HCT: 43.5 % (ref 39.0–52.0)
Hemoglobin: 14.7 g/dL (ref 13.0–17.0)
Lymphocytes Relative: 38.5 % (ref 12.0–46.0)
Lymphs Abs: 2.6 10*3/uL (ref 0.7–4.0)
MCHC: 33.7 g/dL (ref 30.0–36.0)
MCV: 87.7 fl (ref 78.0–100.0)
Monocytes Absolute: 0.5 10*3/uL (ref 0.1–1.0)
Monocytes Relative: 8 % (ref 3.0–12.0)
Neutro Abs: 3.3 10*3/uL (ref 1.4–7.7)
Neutrophils Relative %: 48.2 % (ref 43.0–77.0)
Platelets: 246 10*3/uL (ref 150.0–400.0)
RBC: 4.96 Mil/uL (ref 4.22–5.81)
RDW: 13.6 % (ref 11.5–15.5)
WBC: 6.8 10*3/uL (ref 4.0–10.5)

## 2022-10-09 LAB — COMPREHENSIVE METABOLIC PANEL
ALT: 19 U/L (ref 0–53)
AST: 20 U/L (ref 0–37)
Albumin: 4.8 g/dL (ref 3.5–5.2)
Alkaline Phosphatase: 22 U/L — ABNORMAL LOW (ref 39–117)
BUN: 17 mg/dL (ref 6–23)
CO2: 30 mEq/L (ref 19–32)
Calcium: 10.1 mg/dL (ref 8.4–10.5)
Chloride: 102 mEq/L (ref 96–112)
Creatinine, Ser: 1.25 mg/dL (ref 0.40–1.50)
GFR: 61.76 mL/min (ref >60.00)
Glucose, Bld: 96 mg/dL (ref 70–99)
Potassium: 3.8 mEq/L (ref 3.5–5.1)
Sodium: 138 mEq/L (ref 135–145)
Total Bilirubin: 0.5 mg/dL (ref 0.2–1.2)
Total Protein: 7.8 g/dL (ref 6.0–8.3)

## 2022-10-09 NOTE — Patient Instructions (Signed)
Nonspecific Chest Pain Chest pain can be caused by many different conditions. Some causes of chest pain can be life-threatening. These will require treatment right away. Serious causes of chest pain include: Heart attack. A tear in the body's main blood vessel. Redness and swelling (inflammation) around your heart. Blood clot in your lungs. Other causes of chest pain may not be so serious. These include: Heartburn. Anxiety or stress. Damage to bones or muscles in your chest. Lung infections. Chest pain can feel like: Pain or discomfort in your chest. Crushing, pressure, aching, or squeezing pain. Burning or tingling. Dull or sharp pain that is worse when you move, cough, or take a deep breath. Pain or discomfort that is also felt in your back, neck, jaw, shoulder, or arm, or pain that spreads to any of these areas. It is hard to know whether your pain is caused by something that is serious or something that is not so serious. So it is important to see your doctor right away if you have chest pain. Follow these instructions at home: Medicines Take over-the-counter and prescription medicines only as told by your doctor. If you were prescribed an antibiotic medicine, take it as told by your doctor. Do not stop taking the antibiotic even if you start to feel better. Lifestyle  Rest as told by your doctor. Do not use any products that contain nicotine or tobacco, such as cigarettes, e-cigarettes, and chewing tobacco. If you need help quitting, ask your doctor. Do not drink alcohol. Make lifestyle changes as told by your doctor. These may include: Getting regular exercise. Ask your doctor what activities are safe for you. Eating a heart-healthy diet. A diet and nutrition specialist (dietitian) can help you to learn healthy eating options. Staying at a healthy weight. Treating diabetes or high blood pressure, if needed. Lowering your stress. Activities such as yoga and relaxation techniques  can help. General instructions Pay attention to any changes in your symptoms. Tell your doctor about them or any new symptoms. Avoid any activities that cause chest pain. Keep all follow-up visits as told by your doctor. This is important. You may need more testing if your chest pain does not go away. Contact a doctor if: Your chest pain does not go away. You feel depressed. You have a fever. Get help right away if: Your chest pain is worse. You have a cough that gets worse, or you cough up blood. You have very bad (severe) pain in your belly (abdomen). You pass out (faint). You have either of these for no clear reason: Sudden chest discomfort. Sudden discomfort in your arms, back, neck, or jaw. You have shortness of breath at any time. You suddenly start to sweat, or your skin gets clammy. You feel sick to your stomach (nauseous). You throw up (vomit). You suddenly feel lightheaded or dizzy. You feel very weak or tired. Your heart starts to beat fast, or it feels like it is skipping beats. These symptoms may be an emergency. Do not wait to see if the symptoms will go away. Get medical help right away. Call your local emergency services (911 in the [place] Do not drive yourself to the hospital. Summary Chest pain can be caused by many different conditions. The cause may be serious and need treatment right away. If you have chest pain, see your doctor right away. Follow your doctor's instructions for taking medicines and making lifestyle changes. Keep all follow-up visits as told by your doctor. This includes visits for any further   testing if your chest pain does not go away. Be sure to know the signs that show that your condition has become worse. Get help right away if you have these symptoms. This information is not intended to replace advice given to you by your health care provider. Make sure you discuss any questions you have with your health care provider. Document Revised:  01/25/2022 Document Reviewed: 01/25/2022 Elsevier Patient Education  2024 [employer]  

## 2022-10-09 NOTE — Assessment & Plan Note (Signed)
Clinically stable.  No red flag signs or symptoms. No significant CAD risk factors. Benign examination.  Stable vital signs. Normal EKG. Chest x-ray report reviewed Blood work done today Needs cardiology evaluation Referral placed today In the meantime recommend daily baby aspirin and avoid strenuous exercises physical activity.
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
## 2022-10-10 ENCOUNTER — Other Ambulatory Visit: Payer: Self-pay | Admitting: Emergency Medicine

## 2022-10-10 DIAGNOSIS — E785 Hyperlipidemia, unspecified: Secondary | ICD-10-CM

## 2022-10-10 LAB — HEMOGLOBIN A1C: Hgb A1c MFr Bld: 5.6 % (ref 4.6–6.5)

## 2022-10-10 MED ORDER — ROSUVASTATIN CALCIUM 10 MG PO TABS: 10.0000 mg | ORAL_TABLET | Freq: Every day | ORAL | 3 refills | Status: DC

## 2022-10-24 DIAGNOSIS — F3132 Bipolar disorder, current episode depressed, moderate: Secondary | ICD-10-CM | POA: Diagnosis not present

## 2022-10-24 DIAGNOSIS — F411 Generalized anxiety disorder: Secondary | ICD-10-CM | POA: Diagnosis not present

## 2022-12-12 ENCOUNTER — Encounter: Payer: Self-pay | Admitting: Cardiology

## 2022-12-12 ENCOUNTER — Ambulatory Visit: Payer: BC Managed Care – PPO | Attending: Cardiology | Admitting: Cardiology

## 2022-12-12 VITALS — BP 130/84 | HR 78 | Ht 68.0 in | Wt 158.0 lb

## 2022-12-12 DIAGNOSIS — R072 Precordial pain: Secondary | ICD-10-CM | POA: Diagnosis not present

## 2022-12-12 DIAGNOSIS — I10 Essential (primary) hypertension: Secondary | ICD-10-CM

## 2022-12-12 DIAGNOSIS — E785 Hyperlipidemia, unspecified: Secondary | ICD-10-CM

## 2022-12-12 NOTE — Patient Instructions (Addendum)
Medication Instructions:  No  changes      Lab Work: Not needed    Testing/Procedures: Will be schedule at [address] Your physician has requested that you have an exercise tolerance test. visit . Please also follow instruction sheet, as given.  Do not drink or eat foods with caffeine for 24 hours before the test. (Chocolate, coffee, tea, or energy drinks) If you use an inhaler, bring it with you to the test. Do not smoke for 4 hours before the test. Wear comfortable shoes and clothing.    Follow-Up: At [facility], you and your health needs are our priority.  As part of our continuing mission to provide you with exceptional heart care, we have created designated Provider Care Teams.  These Care Teams include your primary Cardiologist (physician) and Advanced Practice Providers (APPs -  Physician Assistants and Nurse Practitioners) who all work together to provide you with the care you need, when you need it.     Your next appointment:   6 month(s)  The format for your next appointment:   In Person  Provider:   Bryan Lemma, MD

## 2022-12-12 NOTE — Progress Notes (Signed)
Cardiology Office Note:  .   Date:  12/15/2022  ID:  [name], DOB April 29, 1960, MRN [id number] PCP: Etta Grandchild, MD  [facility] Providers Cardiologist:  Bryan Lemma, MD     Chief Complaint  Patient presents with   New Patient (Initial Visit)    Chest pain evaluation.  Distant family history of CAD but also hyperlipidemia.    History of Present Illness: .     Arther Abbott Leisinger "Mellody Dance" is a  62 y.o. male  with a PMH notable for hyperlipidemia (statin intolerant) who presents here for Evaluation of Chest Pain at the request of Etta Grandchild, MD.  Arther Abbott Kohlman was seen by Dr. Edwina Barth on 10/09/2022 with complaint of left-sided chest pain.  Describes a sharp pain that waxes and wanes.  Not associate with other symptoms. => Referred to cardiology for chest pain assessment.  Labs checked.  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  Except that his maternal grandfather had an MI, but his parents health issues that were distraught about the fact that they both had COPD and father had mesothelioma.  His brother is 31 years old and has prostate cancer but otherwise healthy.  Marlena Clipper describes having episodic left-sided chest discomfort that then goes across the chest oftentimes at rest.  It, it goes up and over and then into the middle of the chest.  It does not really come on with any rhyme or reason.  He is not necessarily exertional.  Seems to get better after he takes a nice deep breaths although sometimes the deep breaths himself to make it worse.  Walking around does not make it worse.  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  He denies any chest pain or pressure with these  activities.  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 On retrospection, he usually correlates his chest pain episodes with the fact he has been under a lot of stress at work.  He says that over the last year or so he is made significant changes in his diet and exercise and has lost about 26 pounds.  He was given rosuvastatin by his PCP but never took it after the fourth day because of myalgias.  Cardiovascular ROS: positive for - chest pain and this is not associated with exertion, random radiating across the chest. negative for - dyspnea on exertion, edema, irregular heartbeat, orthopnea, palpitations, paroxysmal nocturnal dyspnea, rapid heart rate, shortness of breath, or syncope or near syncope, TIA or amaurosis fugax, claudication  ROS:  Review of Systems - Negative except lots of social stress and some allergy symptoms.  Notable weight loss-intentional.   Current Meds  Medication Sig   diazepam (VALIUM) 10 MG tablet Take 10 mg by mouth daily as needed for anxiety.    lamoTRIgine (LAMICTAL) 200 MG tablet Take 200 mg by mouth daily.   QUEtiapine (SEROQUEL) 400 MG tablet Take 400 mg by mouth at bedtime.        Objective   Studies Reviewed: Marland Kitchen   EKG Interpretation Date/Time:  Wednesday December 12 2022 15:14:45 EDT Ventricular Rate:  72 PR Interval:  136 QRS Duration:  96 QT Interval:  384 QTC Calculation: 420 R Axis:   48  Text Interpretation: Normal sinus rhythm Normal ECG Confirmed by Bryan Lemma ([id number]) on 12/12/2022 3:57:22 PM    Coronary Calcium Score 12/08/20: Total score 36.5 (mostly LAD) Labs from 10/09/2022: TC 206, TG 190, HDL 41, LDL 127; A1c 5.6.  Hgb 14.7. Cr 1.25, K+ 3.8.   Risk Assessment/Calculations:      Physical Exam:   VS:  BP 130/84   Pulse 78   Ht 5\' 8"  (1.727 m)   Wt 158 lb (71.7 kg)   SpO2 98%   BMI 24.02 kg/m    Wt Readings from Last 3 Encounters:  12/12/22 158 lb (71.7 kg)  10/09/22  159 lb 6 oz (72.3 kg)  02/08/22 166 lb 12.8 oz (75.7 kg)    GEN: Well nourished, well developed in no acute distress; somewhat anxious/nervous appearing but otherwise appearing.  Well-groomed. NECK: No JVD; No carotid bruits CARDIAC: Normal S1, S2; RRR, no murmurs, rubs, gallops RESPIRATORY:  Clear to auscultation without rales, wheezing or rhonchi ; nonlabored, good air movement. ABDOMEN: Soft, non-tender, non-distended EXTREMITIES:  No edema; No deformity      ASSESSMENT AND PLAN: .    Problem List Items Addressed This Visit       Cardiology Problems   Essential hypertension (Chronic)    Listed as a diagnosis, but his blood pressure looks good today on no medications.      Relevant Orders   EKG 12-Lead (Completed)   Cardiac Stress Test: Informed Consent Details: Physician/Practitioner Attestation; Transcribe to consent form and obtain patient signature     Other   Dyslipidemia (Chronic)    LDL 127 with very low risk for a calcium score 2 years ago.  He did not take statin be on 4 days.  Sided significant myalgias.  Prolonged discussion on coronary artery disease-the results of the coronary calcium score and lipid management.  Discussed further screening options.  Would like to see results of his GXT, pending results would potentially consider further ischemic evaluation if indicated versus simply starting Nexlizet and potential CVRR consultation.      Relevant Orders   Cardiac Stress Test: Informed Consent Details: Physician/Practitioner Attestation; Transcribe to consent form and obtain patient signature   EXERCISE TOLERANCE TEST (ETT)   Precordial pain - Primary    Interesting sounding chest pain.  Location is concerning for ischemic CAD related pain, however it is not exertional making it less likely.  Spasm is possible but still unlikely.  Risk factors include hyperlipidemia, male age 76.  Family history is not significant, and BP is well-controlled.  Triglycerides also  relatively well-controlled.  Plan: In a relatively healthy person is very active and interpretable EKG-would start with GXT/ETT      Relevant Orders   Cardiac Stress Test: Informed Consent Details: Physician/Practitioner Attestation; Transcribe to consent form and obtain patient signature   EXERCISE TOLERANCE TEST (ETT)        Informed Consent   Shared Decision Making/Informed Consent{ The risks [chest pain, shortness of breath, cardiac arrhythmias, dizziness, blood pressure fluctuations, myocardial infarction, stroke/transient ischemic attack, and life-threatening complications (estimated to be 1 in 10,000)], benefits (risk stratification, diagnosing coronary artery disease, treatment guidance) and alternatives of an exercise tolerance test were discussed in detail with Mr. Armelin and he agrees to proceed.      Dispo: Return in about 6 months (around 06/11/2023) for 6 month follow-up with me.  Total time spent:  29 min spent with patient + 18 min spent charting = 47 min    Signed, Marykay Lex, MD, MS Bryan Lemma, M.D., M.S. Interventional Cardiologist  [facility]  Pager # [phone] Phone # [phone] [address] [place], [place] [address]

## 2022-12-15 ENCOUNTER — Encounter: Payer: Self-pay | Admitting: Cardiology

## 2022-12-15 NOTE — Assessment & Plan Note (Signed)
Interesting sounding chest pain.  Location is concerning for ischemic CAD related pain, however it is not exertional making it less likely.  Spasm is possible but still unlikely.  Risk factors include hyperlipidemia, male age 62.  Family history is not significant, and BP is well-controlled.  Triglycerides also relatively well-controlled.  Plan: In a relatively healthy person is very active and interpretable EKG-would start with GXT/ETT

## 2022-12-15 NOTE — Assessment & Plan Note (Addendum)
LDL 127 with very low risk for a calcium score 2 years ago.  He did not take statin be on 4 days.  Sided significant myalgias.  Prolonged discussion on coronary artery disease-the results of the coronary calcium score and lipid management.  Discussed further screening options.  Would like to see results of his GXT, pending results would potentially consider further ischemic evaluation if indicated versus simply starting Nexlizet and potential CVRR consultation.

## 2022-12-15 NOTE — Assessment & Plan Note (Signed)
Listed as a diagnosis, but his blood pressure looks good today on no medications.

## 2022-12-17 ENCOUNTER — Telehealth (HOSPITAL_COMMUNITY): Payer: Self-pay | Admitting: *Deleted

## 2022-12-17 ENCOUNTER — Encounter (HOSPITAL_COMMUNITY): Payer: Self-pay | Admitting: *Deleted

## 2022-12-17 NOTE — Telephone Encounter (Signed)
Letter sent to patient  via my chart for upcoming stress test.Colton Andersen, Colton Andersen

## 2022-12-20 ENCOUNTER — Ambulatory Visit (HOSPITAL_COMMUNITY): Payer: BC Managed Care – PPO | Attending: Cardiology

## 2022-12-20 DIAGNOSIS — E785 Hyperlipidemia, unspecified: Secondary | ICD-10-CM | POA: Insufficient documentation

## 2022-12-20 DIAGNOSIS — R072 Precordial pain: Secondary | ICD-10-CM | POA: Diagnosis not present

## 2022-12-20 LAB — EXERCISE TOLERANCE TEST
Angina Index: 0
Duke Treadmill Score: 10
Estimated workload: 12.1
Exercise duration (min): 10 min
Exercise duration (sec): 15 s
MPHR: 158 {beats}/min
Peak HR: 160 {beats}/min
Percent HR: 101 %
Rest HR: 86 {beats}/min
ST Depression (mm): 0 mm

## 2023-01-23 DIAGNOSIS — F411 Generalized anxiety disorder: Secondary | ICD-10-CM | POA: Diagnosis not present

## 2023-01-23 DIAGNOSIS — F3132 Bipolar disorder, current episode depressed, moderate: Secondary | ICD-10-CM | POA: Diagnosis not present

## 2023-03-21 ENCOUNTER — Encounter: Payer: Self-pay | Admitting: Internal Medicine

## 2023-06-19 ENCOUNTER — Ambulatory Visit: Payer: BC Managed Care – PPO | Attending: Cardiology | Admitting: Cardiology

## 2023-06-19 ENCOUNTER — Encounter: Payer: Self-pay | Admitting: Cardiology

## 2023-06-19 VITALS — BP 134/86 | HR 84 | Ht 68.0 in | Wt 157.0 lb

## 2023-06-19 DIAGNOSIS — R072 Precordial pain: Secondary | ICD-10-CM | POA: Diagnosis not present

## 2023-06-19 DIAGNOSIS — E785 Hyperlipidemia, unspecified: Secondary | ICD-10-CM | POA: Diagnosis not present

## 2023-06-19 DIAGNOSIS — I10 Essential (primary) hypertension: Secondary | ICD-10-CM

## 2023-06-19 NOTE — Progress Notes (Signed)
 Cardiology Office Note:  .   Date:  06/22/2023  ID:  Colton Andersen, DOB 1960-12-24, MRN [id number] PCP: Colton Grandchild, MD  [facility] Providers Cardiologist:  Bryan Lemma, MD     No chief complaint on file.   Patient Profile: .     Colton Andersen is a  63 y.o. male  with a PMH notable for HTN, HLD (statin intolerant) with a CAC of 36.5 who presents here for 73-month follow-up reassessment of chest pain at the request of Colton Grandchild, MD.  Colton Andersen was seen by Dr. Edwina Barth on 10/09/2022 with complaint of left-sided chest pain.  He described a sharp pain that waxes and wanes.  Not associate with other symptoms. => Referred to cardiology for chest pain assessment.  Labs checked.  (Personally reviewed) Other pertinent PMH-history of hepatitis C      Colton Andersen was seen on December 12, 2022 for evaluation of precordial pain.  Plan was GXT.  He returns for follow-up.  Subjective  Discussed the use of AI scribe software for clinical note transcription with the patient, who gave verbal consent to proceed.  History of Present Illness   Colton Andersen is a 63 year old male who presents for follow-up after a stress test.  He underwent a stress test in September, achieving 12 metabolic equivalents and exceeding 100% of his maximum predicted heart rate without experiencing chest pain or concerning EKG changes. He felt momentary discomfort after the test but not during it.  Significant lifestyle changes have been made since the last visit, including dietary modifications to reduce cholesterol. His diet now consists of eggs cooked in olive oil, avocado, and dry toast four days a week, and oatmeal with non-butter spread and buttered toast three days a week. He has eliminated red meat and incorporates Asian and [place] stir-fries with chicken and vegetables into his meals, allowing himself pizza or tacos on Fridays.  He reports increased physical activity,  stating that he is now engaged in 99% physical work, which has contributed to a weight loss of approximately 10 pounds, from 164 to 157 pounds.  No swelling in his legs, pain in his calves or thighs when walking, shortness of breath when lying flat, or episodes of heart racing or skipping beats.         Objective   Meds:-No cardiac meds.  He takes as needed Valium for anxiety, 40 mg Seroquel nightly for sleep, Lamictal 200 mg daily  Studies Reviewed: Marland Kitchen       LABS Total Cholesterol: 206 (11/2022) LDL: 127 (11/2022)  RADIOLOGY Coronary Calcium Score: 36 (11/2022)  DIAGNOSTIC GXT/ETT 12/20/2022: Excellent exercise tolerance, no ischemic EKG changes- LOW RISK Good exercise capacity-exercise over 10 minutes.  12 METS.  101% max predicted heart rate.  Fleeting episode of chest pain after exercise but not with exercise.  No EKG changes.      Risk Assessment/Calculations:         Physical Exam:   VS:  BP 134/86 (BP Location: Left Arm, Patient Position: Sitting, Cuff Size: Normal)   Pulse 84   Ht 5\' 8"  (1.727 m)   Wt 157 lb (71.2 kg)   SpO2 96%   BMI 23.87 kg/m    Wt Readings from Last 3 Encounters:  06/19/23 157 lb (71.2 kg)  12/12/22 158 lb (71.7 kg)  10/09/22 159 lb 6 oz (72.3 kg)    GEN: Healthy-Appearing. Well nourished, well-groomed in no acute distress;  NECK: No JVD; No carotid bruits CARDIAC: Normal S1, S2; RRR, no murmurs, rubs, gallops RESPIRATORY:  Clear to auscultation without rales, wheezing or rhonchi ; nonlabored, good air movement. ABDOMEN: Soft, non-tender, non-distended EXTREMITIES:  No edema; No deformity     ASSESSMENT AND PLAN: .    Problem List Items Addressed This Visit       Cardiology Problems   Essential hypertension (Chronic)   Borderline blood pressures today.  Being monitored by PCP.  Not currently on medications.  Open for lifestyle modifications to make a difference.        Other   Dyslipidemia - Primary (Chronic)   Coronary calcium  score of 36 requires cholesterol management. 10-pound weight loss achieved through lifestyle changes. Goal: LDL <100 mg/dL, total cholesterol <098 mg/dL. Previous statin intolerance noted. Nexelazep considered if necessary. Liver function monitoring essential due to hepatitis C history. - Order fasting lipid panel in the next couple of weeks. - Advise continuation of current lifestyle modifications. - Consider starting Nexlizet 10-180 mg if cholesterol levels are not adequately controlled with lifestyle changes. - Monitor liver function tests if new cholesterol-lowering medication is initiated.      Relevant Orders   Hepatic function panel (Completed)   Lipid panel (Completed)   Precordial pain   No episodes since September. Stress test achieved 12 METs, no EKG changes or pain, indicating non-cardiac origin. Improvement linked to reduced stress.        Follow-up Contingent on upcoming blood work results. Further intervention may be necessary if cholesterol is not well-controlled. - Schedule follow-up based on lab results. - If labs are favorable, follow-up with primary care physician as needed. - If labs are unfavorable, consider further cardiology follow-up. Return if symptoms worsen or fail to improve, for Followup when necessary.  Recording duration: 12 minutes        Signed, Marykay Lex, MD, MS Bryan Lemma, M.D., M.S. Interventional Cardiologist  [facility]  Pager # [phone] Phone # [phone] [address] [place], [place] [address]

## 2023-06-19 NOTE — Patient Instructions (Signed)
 Medication Instructions:  Not needed  *If you need a refill on your cardiac medications before your next appointment, please call your pharmacy*   Lab Work:fasting Lipid Hepatic panel If you have labs (blood work) drawn today and your tests are completely normal, you will receive your results only by: MyChart Message (if you have MyChart) OR A paper copy in the mail If you have any lab test that is abnormal or we need to change your treatment, we will call you to review the results.   Testing/Procedures: Not needed   Follow-Up: At [facility], you and your health needs are our priority.  As part of our continuing mission to provide you with exceptional heart care, we have created designated Provider Care Teams.  These Care Teams include your primary Cardiologist (physician) and Advanced Practice Providers (APPs -  Physician Assistants and Nurse Practitioners) who all work together to provide you with the care you need, when you need it.     Your next appointment:     Depending on lab result next will as needed  The format for your next appointment:   In Person  Provider:   Bryan Lemma, MD    Other Instructions   s

## 2023-06-21 LAB — HEPATIC FUNCTION PANEL
ALT: 23 IU/L (ref 0–44)
AST: 23 IU/L (ref 0–40)
Albumin: 4.7 g/dL (ref 3.9–4.9)
Alkaline Phosphatase: 29 IU/L — ABNORMAL LOW (ref 44–121)
Bilirubin Total: 0.4 mg/dL (ref 0.0–1.2)
Bilirubin, Direct: 0.16 mg/dL (ref 0.00–0.40)
Total Protein: 7.4 g/dL (ref 6.0–8.5)

## 2023-06-21 LAB — LIPID PANEL
Chol/HDL Ratio: 4.3 ratio (ref 0.0–5.0)
Cholesterol, Total: 205 mg/dL — ABNORMAL HIGH (ref 100–199)
HDL: 48 mg/dL (ref >39)
LDL Chol Calc (NIH): 141 mg/dL — ABNORMAL HIGH (ref 0–99)
Triglycerides: 89 mg/dL (ref 0–149)
VLDL Cholesterol Cal: 16 mg/dL (ref 5–40)

## 2023-06-22 ENCOUNTER — Encounter: Payer: Self-pay | Admitting: Cardiology

## 2023-06-22 NOTE — Assessment & Plan Note (Signed)
 No episodes since September. Stress test achieved 12 METs, no EKG changes or pain, indicating non-cardiac origin. Improvement linked to reduced stress.

## 2023-06-22 NOTE — Assessment & Plan Note (Signed)
 Borderline blood pressures today.  Being monitored by PCP.  Not currently on medications.  Open for lifestyle modifications to make a difference.

## 2023-06-22 NOTE — Assessment & Plan Note (Signed)
 Coronary calcium score of 36 requires cholesterol management. 10-pound weight loss achieved through lifestyle changes. Goal: LDL <100 mg/dL, total cholesterol <161 mg/dL. Previous statin intolerance noted. Nexelazep considered if necessary. Liver function monitoring essential due to hepatitis C history. - Order fasting lipid panel in the next couple of weeks. - Advise continuation of current lifestyle modifications. - Consider starting Nexlizet 10-180 mg if cholesterol levels are not adequately controlled with lifestyle changes. - Monitor liver function tests if new cholesterol-lowering medication is initiated.

## 2023-06-26 MED ORDER — NEXLIZET 180-10 MG PO TABS: 1.0000 | ORAL_TABLET | Freq: Every day | ORAL | 11 refills | Status: DC

## 2023-06-27 ENCOUNTER — Telehealth: Payer: Self-pay | Admitting: Pharmacy Technician

## 2023-06-27 NOTE — Telephone Encounter (Signed)
 Pharmacy Patient Advocate Encounter   Received notification from CoverMyMeds that prior authorization for nexlizet is required/requested.   Insurance verification completed.   The patient is insured through [facility] .   Per test claim: PA required; PA submitted to above mentioned insurance via CoverMyMeds Key/confirmation #/EOC [id number] Status is pending

## 2023-06-28 NOTE — Telephone Encounter (Signed)
Message sent to Dr.Harding for advice. 

## 2023-06-28 NOTE — Telephone Encounter (Signed)
 Pharmacy Patient Advocate Encounter  Received notification from [facility] that Prior Authorization for nexlizet has been DENIED.  Full denial letter will be uploaded to the media tab. See denial reason below.    PA #/Case ID/Reference #: [id number]

## 2023-06-30 NOTE — Telephone Encounter (Signed)
 Lets just do Zetia then   [facility]

## 2023-07-02 ENCOUNTER — Other Ambulatory Visit: Payer: Self-pay

## 2023-07-02 MED ORDER — EZETIMIBE 10 MG PO TABS: 10.0000 mg | ORAL_TABLET | Freq: Every day | ORAL | 3 refills | Status: AC

## 2023-07-02 NOTE — Telephone Encounter (Signed)
 Spoke to patient insurance denied Nexlizet.Dr.Harding advised Zetia 10 mg daily.Advised to have repeat lipid panel done in 3 months with PCP.

## 2023-12-30 ENCOUNTER — Ambulatory Visit: Admitting: Family Medicine

## 2023-12-30 ENCOUNTER — Encounter: Payer: Self-pay | Admitting: Family Medicine

## 2023-12-30 ENCOUNTER — Ambulatory Visit: Payer: Self-pay | Admitting: Family Medicine

## 2023-12-30 ENCOUNTER — Ambulatory Visit (HOSPITAL_COMMUNITY)
Admission: RE | Admit: 2023-12-30 | Discharge: 2023-12-30 | Disposition: A | Discharge status: Home / Self Care | Source: Ambulatory Visit | Attending: Family Medicine | Admitting: Family Medicine

## 2023-12-30 ENCOUNTER — Ambulatory Visit: Payer: Self-pay

## 2023-12-30 VITALS — BP 138/78 | HR 82 | Temp 98.4°F | Resp 18 | Ht 68.0 in | Wt 159.0 lb

## 2023-12-30 DIAGNOSIS — N50811 Right testicular pain: Secondary | ICD-10-CM

## 2023-12-30 DIAGNOSIS — N5089 Other specified disorders of the male genital organs: Secondary | ICD-10-CM

## 2023-12-30 NOTE — Telephone Encounter (Signed)
 FYI Only or Action Required?: FYI only for provider.  Patient was last seen in primary care on 10/09/2022 by Purcell Emil Schanz, MD.  Called Nurse Triage reporting Groin Swelling.  Symptoms began several days ago.  Interventions attempted: Nothing.  Symptoms are: gradually worsening.  Triage Disposition: See HCP Within 4 Hours (Or PCP Triage)  Patient/caregiver understands and will follow disposition?: Yes      Copied from CRM [id number]. Topic: Clinical - Red Word Triage >> Dec 30, 2023  8:03 AM Colton Andersen wrote: Red Word that prompted transfer to Nurse Triage: Right testicle swollen and there is growth on the bottom. Notice it Friday evening and it has continue to swell. Patient having difficulty walking, sitting. Reason for Disposition  Leg or ankle swelling also present    Denies any additional swelling but states he had a growth in the area.  Answer Assessment - Initial Assessment Questions 1. SCROTAL SWELLING: What does the scrotum look like? How swollen is it? (mild, moderate severe; compare to other side)     Swelling to one side, painful with movement   2. LOCATION: Where is the swelling located?     R testicle swelling  3. ONSET: When did the swelling start?     Friday  4. PATTERN: Does it come and go, or has it been constant since it started?     Constant  5. SCROTAL PAIN: Is there any pain? If Yes, ask: How bad is it?  (Scale 1-10; or mild, moderate, severe)     4 or 5 out of 10  6. OTHER SYMPTOMS: Do you have any other symptoms? (e.g., abdomen pain, difficulty passing urine, fever, vomiting)     Growth at the bottom as well. A lump that feels irregular in shape  Protocols used: Scrotum Swelling-A-AH

## 2023-12-30 NOTE — Progress Notes (Signed)
 Assessment & Plan Testicular mass STAT US  Orders:   PSA; Future   CBC with Differential/Platelet; Future   US  SCROTUM W/DOPPLER; Future   Follow up plan: Return if symptoms worsen or fail to improve.  Niki Rung, MSN, APRN, FNP-C  Subjective:  HPI: Colton Andersen is a 63 y.o. male presenting on 12/30/2023 for Testicle Pain (Swollen left testicle - noticed Fri AM - getting larger and found a growth there. Pain has started also )  Discussed the use of AI scribe software for clinical note transcription with the patient, who gave verbal consent to proceed.  He noticed testicular swelling on Friday, which has progressively worsened over the weekend. The pain is continuous and exacerbated by certain movements, such as sitting down or walking in a particular gait. He describes a noticeable growth on the bottom of the testicle. He is uncertain if it is new or if he just now noticed it as he has not been completing self testicular exams.   He has a history of epididymitis and recalls being treated with antibiotics in the past, although he cannot remember the specific medication. He notes that the current symptoms are more severe than his previous experiences with epididymitis.  He reports an increase in urination frequency over the past month or two but denies any blood in his urine. He attributes the increased urination to aging but is uncertain if it is related to his current condition.  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       ROS: Negative unless specifically indicated above in HPI.   Relevant past medical history reviewed and updated as indicated.   Allergies and medications reviewed and updated.   Current Outpatient Medications:    diazepam (VALIUM) 10 MG tablet, Take 10 mg by mouth daily as needed for anxiety. , Disp: , Rfl:    ezetimibe   (ZETIA ) 10 MG tablet, Take 1 tablet (10 mg total) by mouth daily., Disp: 90 tablet, Rfl: 3   lamoTRIgine (LAMICTAL) 200 MG tablet, Take 200 mg by mouth daily., Disp: , Rfl:    QUEtiapine (SEROQUEL) 400 MG tablet, Take 400 mg by mouth at bedtime. , Disp: , Rfl: 4   clobetasol  ointment (TEMOVATE ) 0.05 %, Apply 1 Application topically 2 (two) times daily. (Patient not taking: Reported on 06/19/2023), Disp: 45 g, Rfl: 0  Allergies  Allergen Reactions   Bactrim  [Sulfamethoxazole -Trimethoprim ] Swelling   Penicillins Anaphylaxis, Swelling and Rash    Swelling of tongue Did it involve swelling of the face/tongue/throat, SOB, or low BP? Yes Did it involve sudden or severe rash/hives, skin peeling, or any reaction on the inside of your mouth or nose? Yes Did you need to seek medical attention at a hospital or doctor's office? Yes When did it last happen? 2 years ago (2019)     If all above answers are "NO", may proceed with cephalosporin use.   Rosuvastatin  Other (See Comments)    Profound myalgias and confusion.   Codeine Itching    Objective:   BP 138/78   Pulse 82   Temp 98.4 F (36.9 C)   Resp 18   Ht 5' 8 (1.727 m)   Wt 159 lb (72.1 kg)   BMI 24.18 kg/m    Physical Exam Vitals reviewed. Exam conducted with a chaperone present.  Constitutional:      General: He is not in acute distress.  Appearance: Normal appearance. He is not ill-appearing, toxic-appearing or diaphoretic.  HENT:     Head: Normocephalic and atraumatic.  Eyes:     General: No scleral icterus.       Right eye: No discharge.        Left eye: No discharge.     Conjunctiva/sclera: Conjunctivae normal.  Cardiovascular:     Rate and Rhythm: Normal rate.  Pulmonary:     Effort: Pulmonary effort is normal. No respiratory distress.  Genitourinary:    Penis: Normal.      Testes:        Right: Mass, tenderness and swelling present. Testicular hydrocele or varicocele not present. Right testis is descended.         Left: Mass, tenderness, swelling, testicular hydrocele or varicocele not present. Left testis is descended.     Epididymis:     Right: Normal.     Left: Normal.  Musculoskeletal:        General: Normal range of motion.     Cervical back: Normal range of motion.  Skin:    General: Skin is warm and dry.  Neurological:     Mental Status: He is alert and oriented to person, place, and time. Mental status is at baseline.  Psychiatric:        Mood and Affect: Mood normal.        Behavior: Behavior normal.        Thought Content: Thought content normal.        Judgment: Judgment normal.

## 2023-12-31 ENCOUNTER — Other Ambulatory Visit (INDEPENDENT_AMBULATORY_CARE_PROVIDER_SITE_OTHER)

## 2023-12-31 DIAGNOSIS — N5089 Other specified disorders of the male genital organs: Secondary | ICD-10-CM | POA: Diagnosis not present

## 2023-12-31 LAB — CBC WITH DIFFERENTIAL/PLATELET
Basophils Absolute: 0 K/uL (ref 0.0–0.1)
Basophils Relative: 0.6 % (ref 0.0–3.0)
Eosinophils Absolute: 0.3 K/uL (ref 0.0–0.7)
Eosinophils Relative: 4.2 % (ref 0.0–5.0)
HCT: 40.9 % (ref 39.0–52.0)
Hemoglobin: 14 g/dL (ref 13.0–17.0)
Lymphocytes Relative: 35.4 % (ref 12.0–46.0)
Lymphs Abs: 2.3 K/uL (ref 0.7–4.0)
MCHC: 34.1 g/dL (ref 30.0–36.0)
MCV: 87.8 fl (ref 78.0–100.0)
Monocytes Absolute: 0.5 K/uL (ref 0.1–1.0)
Monocytes Relative: 8.2 % (ref 3.0–12.0)
Neutro Abs: 3.4 K/uL (ref 1.4–7.7)
Neutrophils Relative %: 51.6 % (ref 43.0–77.0)
Platelets: 243 K/uL (ref 150.0–400.0)
RBC: 4.66 Mil/uL (ref 4.22–5.81)
RDW: 13.8 % (ref 11.5–15.5)
WBC: 6.5 K/uL (ref 4.0–10.5)

## 2023-12-31 LAB — PSA: PSA: 2.05 ng/mL (ref 0.10–4.00)

## 2024-01-01 ENCOUNTER — Telehealth: Payer: Self-pay

## 2024-01-01 ENCOUNTER — Encounter: Payer: Self-pay | Admitting: Family Medicine

## 2024-01-01 NOTE — Telephone Encounter (Signed)
 I am okay with that note being provided.

## 2024-01-01 NOTE — Telephone Encounter (Signed)
 No ma'am

## 2024-01-01 NOTE — Telephone Encounter (Signed)
 Do I need to handle this ?

## 2024-01-01 NOTE — Telephone Encounter (Signed)
 Copied from CRM [id number]. Topic: Medical Record Request - Records Request >> Jan 01, 2024  9:55 AM Wyona SQUIBB wrote: Reason for CRM: Pt called in because saw FNP Merlynn on 10/06 pt needs a work note that extends to Friday and   Pt will return to work Monday next week on 01/06/2024 with light restrictions until he sees urologist.   Pt would like a call when ready for pick  up
